# Patient Record
Sex: Male | Born: 1955 | Race: White | Hispanic: No | Marital: Married | Smoking: Current every day smoker
Health system: Southern US, Community
[De-identification: ages and names within clinical notes are randomized; demographics above are authoritative.]

## PROBLEM LIST (undated history)

## (undated) DIAGNOSIS — S3992XA Unspecified injury of lower back, initial encounter: Secondary | ICD-10-CM

## (undated) DIAGNOSIS — N289 Disorder of kidney and ureter, unspecified: Secondary | ICD-10-CM

## (undated) DIAGNOSIS — I10 Essential (primary) hypertension: Secondary | ICD-10-CM

## (undated) DIAGNOSIS — G8929 Other chronic pain: Secondary | ICD-10-CM

## (undated) DIAGNOSIS — E119 Type 2 diabetes mellitus without complications: Secondary | ICD-10-CM

## (undated) HISTORY — PX: ANKLE SURGERY: SHX546

## (undated) HISTORY — PX: BACK SURGERY: SHX140

## (undated) HISTORY — PX: OTHER SURGICAL HISTORY: SHX169

## (undated) HISTORY — PX: SPINAL CORD STIMULATOR IMPLANT: SHX2422

---

## 2005-04-09 ENCOUNTER — Emergency Department: Payer: Self-pay | Admitting: Emergency Medicine

## 2006-05-18 ENCOUNTER — Emergency Department: Payer: Self-pay | Admitting: Internal Medicine

## 2006-08-09 ENCOUNTER — Emergency Department: Payer: Self-pay | Admitting: Emergency Medicine

## 2009-07-12 ENCOUNTER — Ambulatory Visit: Payer: Self-pay | Admitting: Internal Medicine

## 2011-11-29 ENCOUNTER — Inpatient Hospital Stay: Payer: Self-pay | Admitting: Student

## 2011-11-29 LAB — URINALYSIS, COMPLETE
Glucose,UR: NEGATIVE mg/dL (ref 0–75)
Nitrite: NEGATIVE
Ph: 5 (ref 4.5–8.0)
Protein: 100
RBC,UR: 170 /HPF (ref 0–5)
Specific Gravity: 1.027 (ref 1.003–1.030)

## 2011-11-29 LAB — COMPREHENSIVE METABOLIC PANEL
Albumin: 4 g/dL (ref 3.4–5.0)
Alkaline Phosphatase: 78 U/L (ref 50–136)
Anion Gap: 14 (ref 7–16)
BUN: 17 mg/dL (ref 7–18)
Calcium, Total: 8.9 mg/dL (ref 8.5–10.1)
Chloride: 100 mmol/L (ref 98–107)
Creatinine: 1.76 mg/dL — ABNORMAL HIGH (ref 0.60–1.30)
Osmolality: 275 (ref 275–301)
Potassium: 3.4 mmol/L — ABNORMAL LOW (ref 3.5–5.1)
SGOT(AST): 25 U/L (ref 15–37)
Sodium: 134 mmol/L — ABNORMAL LOW (ref 136–145)

## 2011-11-29 LAB — CBC
HCT: 45.3 % (ref 40.0–52.0)
HGB: 15 g/dL (ref 13.0–18.0)
MCH: 29.3 pg (ref 26.0–34.0)
MCHC: 33.2 g/dL (ref 32.0–36.0)
MCV: 88 fL (ref 80–100)
RBC: 5.12 10*6/uL (ref 4.40–5.90)

## 2011-11-29 LAB — TROPONIN I: Troponin-I: <0.02 ng/mL

## 2011-11-29 LAB — PROTIME-INR
INR: 1.1
Prothrombin Time: 14.1 secs (ref 11.5–14.7)

## 2011-11-29 LAB — CK TOTAL AND CKMB (NOT AT ARMC)
CK, Total: 75 U/L (ref 35–232)
CK, Total: 67 U/L (ref 35–232)
CK-MB: <0.5 ng/mL — ABNORMAL LOW (ref 0.5–3.6)

## 2011-11-29 LAB — APTT: Activated PTT: 30 secs (ref 23.6–35.9)

## 2011-11-30 LAB — CBC WITH DIFFERENTIAL/PLATELET
Bands: 1 %
HGB: 13.9 g/dL (ref 13.0–18.0)
Lymphocytes: 13 %
MCH: 30.6 pg (ref 26.0–34.0)
MCHC: 34.3 g/dL (ref 32.0–36.0)
MCV: 89 fL (ref 80–100)
Monocytes: 1 %
RBC: 4.54 10*6/uL (ref 4.40–5.90)
Segmented Neutrophils: 85 %
WBC: 25.8 10*3/uL — ABNORMAL HIGH (ref 3.8–10.6)

## 2011-11-30 LAB — LIPID PANEL
HDL Cholesterol: 23 mg/dL — ABNORMAL LOW (ref 40–60)
Ldl Cholesterol, Calc: 92 mg/dL (ref 0–100)
Triglycerides: 171 mg/dL (ref 0–200)
VLDL Cholesterol, Calc: 34 mg/dL (ref 5–40)

## 2011-11-30 LAB — TROPONIN I: Troponin-I: <0.02 ng/mL

## 2011-11-30 LAB — BASIC METABOLIC PANEL
Anion Gap: 11 (ref 7–16)
Chloride: 106 mmol/L (ref 98–107)
Co2: 23 mmol/L (ref 21–32)
Creatinine: 1.68 mg/dL — ABNORMAL HIGH (ref 0.60–1.30)
EGFR (African American): 53 — ABNORMAL LOW
EGFR (Non-African Amer.): 45 — ABNORMAL LOW
Osmolality: 285 (ref 275–301)
Potassium: 4.3 mmol/L (ref 3.5–5.1)
Sodium: 140 mmol/L (ref 136–145)

## 2011-11-30 LAB — CK TOTAL AND CKMB (NOT AT ARMC): CK-MB: <0.5 ng/mL — ABNORMAL LOW (ref 0.5–3.6)

## 2011-12-01 LAB — BASIC METABOLIC PANEL
Anion Gap: 8 (ref 7–16)
BUN: 21 mg/dL — ABNORMAL HIGH (ref 7–18)
Co2: 25 mmol/L (ref 21–32)
Creatinine: 1.24 mg/dL (ref 0.60–1.30)
EGFR (African American): >60
EGFR (Non-African Amer.): >60
Osmolality: 285 (ref 275–301)
Potassium: 3.8 mmol/L (ref 3.5–5.1)
Sodium: 141 mmol/L (ref 136–145)

## 2011-12-01 LAB — CBC WITH DIFFERENTIAL/PLATELET
Basophil #: 0 10*3/uL (ref 0.0–0.1)
Eosinophil %: 1.4 %
Lymphocyte #: 1.7 10*3/uL (ref 1.0–3.6)
Lymphocyte %: 16 %
MCHC: 33.4 g/dL (ref 32.0–36.0)
Monocyte #: 0.7 x10 3/mm (ref 0.2–1.0)
RBC: 3.66 10*6/uL — ABNORMAL LOW (ref 4.40–5.90)
WBC: 10.5 10*3/uL (ref 3.8–10.6)

## 2011-12-04 LAB — CULTURE, BLOOD (SINGLE)

## 2012-04-25 ENCOUNTER — Ambulatory Visit: Payer: Self-pay

## 2014-02-15 ENCOUNTER — Emergency Department: Payer: Self-pay | Admitting: Emergency Medicine

## 2014-10-14 NOTE — Discharge Summary (Signed)
PATIENT NAME:  Shane Mercado, ANDERSSON MR#:  [id number] DATE OF BIRTH:  10-29-1955  DATE OF ADMISSION:  11/29/2011 DATE OF DISCHARGE:  12/02/2011  PRIMARY CARE PHYSICIAN: Dr. Wilford Grist [facility]  CHIEF COMPLAINT: Fever, dysuria.   DISCHARGE DIAGNOSES:  1. Sepsis in the setting of complicated urinary tract infection and renal failure which is likely acute. 2. Hyponatremia. 3. Hypokalemia. 4. Tachycardia, resolved.  5. Possible obstructive sleep apnea.   SECONDARY DIAGNOSES:  1. Diabetes. 2. Hypertension. 3. Chronic back pain. 4. Spinal stenosis.  5. Osteoporosis.  6. History of epidural lipomatosis at L4-L5. 7. 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   DISCHARGE MEDICATIONS:  1. Cipro 500 mg every 12 hours for seven days. 2. Zolpidem 10 mg once a day at night as needed.  3. AndroGel pack two packs transdermal on one day then on the next day one pack. 4. Baclofen 20 mg 1 to 2 tabs four times daily. 5. Alendronate 70 mg weekly. 6. Novolin N 35 units in the morning and 25 units in the evening. 7. Propranolol 40 mg 1 tab twice daily.  8. OxyContin extended-release 30 mg in the morning, 20 mg at noon, and 20 mg at bedtime. 9. Primidone 50 mg daily. 10. Cymbalta 30 mg twice a day.  11. Simvastatin 20 mg daily.  12. Trazodone 150 mg 2 tabs once a day at bedtime.  13. Promethazine 25 mg every six hours as needed for vomiting.  14. Gabapentin 900 mg three times daily and 600 mg once daily.   DIET: Low sodium, ADA diet.   ACTIVITY: As tolerated.   DISCHARGE FOLLOWUP: Please follow-up with your primary care physician and check a BMP within one week and get a referral for a sleep study.   DISPOSITION: Home.   HISTORY OF PRESENT ILLNESS: For full details of the history and physical, please see the dictation on 11/29/2011 by Dr. Krystal Eaton, but briefly this is a 59 year old obese male with the above medical history who presented with urinary tract  infection and renal failure with complaints of dysuria and fever and was noted to have significant tachycardia in 150s with significant leukocytosis and admitted to the hospitalist service.  SIGNIFICANT LABS AND IMAGING: Initial BUN 17, creatinine 1.76, sodium 134, potassium 3.4.   Urine cultures have grown Escherichia coli, which is pansensitive.  Blood cultures from arrival: No growth to date x2.   D-dimer slightly elevated at 0.66. INR 1.1. Initial WBC 29.7. WBC as of 12/01/2011 was normalized at 10.5 and creatinine was 1.24.   Urinalysis: 1996 WBCs, 3+ leukocyte esterase, no nitrites, 2+ bacteria.   ABG: pH 7.4, pCO2 33, and pO2 71.   Echocardiogram done on 11/30/2011 showed normal ejection fraction. Spectral Doppler flow pattern is normal for age.   Chest x-ray, one view, showed no acute disease of the chest.   Ultrasound of the kidneys bilaterally showed normal renal ultrasound.  Bilateral lower extremity Doppler for deep vein thrombosis did not find evidence for deep vein thrombosis.   HOSPITAL COURSE: The patient was admitted to the hospitalist service and given Levaquin in the ER and started on ceftriaxone. The patient was also started on IV fluids and cultures including blood and urine were obtained. The patient had criteria for sepsis including tachycardia, fever, and significant leukocytosis on arrival. Currently his urine culture is back and is positive for Escherichia coli and he is to continue Cipro for an additional seven days as he has had two  days of ceftriaxone IV. He has no further fever and leukocytosis has significantly improved.   In regards to his renal failure, it was likely secondary to the complicated urinary tract infection and poor p.o. intake prior to arrival, and he was started on aggressive IV fluid resuscitation. An ultrasound of the kidneys was done bilaterally being normal. He had significant improvement with fluid resuscitation and creatinine as of  12/01/2011 is 1.24. He was advised to follow-up with his primary care physician and obtain a repeat BNP for kidney check.   Furthermore, he has mild hyponatremia and hypokalemia which also corrected with IV fluids and potassium replacement. He did have mildly elevated d-dimers and although a PE was considered he did not have any significant shortness of breath nor hypoxia on arrival and therefore ultrasound of the lower extremities was done which was negative for deep vein thrombosis.   He did have sepsis per criteria as well as acute renal failure both of which can potentially cause the elevation of d-dimer. He is not hypoxic at room air nor is he tachycardic and he would be advised to follow up with his primary care physician as an outpatient. He did have loud snoring at night and periods of waking up multiple times at night. There is a concern for possible undiagnosed obstructive sleep apnea, however, he stated that three to four years ago he did have a sleep study and was told it was normal; however, per his wife, his symptoms have progressed he has gained significantly more weight and has worsening symptoms. Therefore, a repeat sleep         study was recommended for him. While hospitalized his pain medications were continued. At this point, he will be discharged with outpatient follow-up as dictated above.   CODE STATUS: FULL CODE.   TOTAL TIME SPENT: 35 minutes.  ____________________________ Krystal EatonShayiq Allex Madia, MD sa:slb D: 12/02/2011 14:08:55 ET T: 12/03/2011 10:24:24 ET JOB#: [id number]  cc: Krystal EatonShayiq Jasyn Mey, MD, <Dictator> Dr. Wilford [facility]istKoontz [facility] Shandie Bertz MD ELECTRONICALLY SIGNED 12/11/2011 [id number]

## 2014-10-14 NOTE — H&P (Signed)
PATIENT NAME:  Shane Mercado, [name] MR#:  [id number] DATE OF BIRTH:  [birth year]  DATE OF ADMISSION:  11/29/2011  REFERRING PHYSICIAN: Joseph ArtAlice Finnell, MD   PRIMARY CARE PHYSICIAN: [facility]   REASON FOR CONSULTATION: Fever, dysuria.   HISTORY OF PRESENT ILLNESS: The patient is a 59 year old morbidly obese Caucasian male with history of diabetes, hypertension, chronic back pain and spinal stenosis status post spinal cord stimulator who presents with the above complaint. The patient stated that his symptoms started three to four days ago with fevers, chills, and dysuria. The patient feels like he is "peeing razor blades". He's had decreased p.o. intake and decreased urination and feels dehydrated. Last night he did have a bout of chest pain which he describes more of palpitations. It went on for several hours. He has no chest pains, dizziness. Pain was not radiating. He had no visual changes. On arrival he was found to have significant tachycardia with rates of 150's and had a positive urinalysis and acute renal failure and the hospitalist service was contacted for further evaluation and management.   PAST MEDICAL HISTORY:  1. Diabetes. 2. Hypertension.  3. Chronic back pain. 4. Spinal stenosis. 5. Osteoporosis.  6. Epidural lipomatosis at L4-L5. 7. 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   FAMILY HISTORY: Dad with Parkinson's, coronary artery disease status post coronary artery bypass graft and cancer. Mom with colon cancer.   SOCIAL HISTORY: Smokes 1 to 2 cigars a month. Denies alcohol or other drug use. He is disabled.    MEDICATIONS: These are not confirmed but, per patient: 1. OxyContin 60 mg in the morning, 40 mg afternoon, and 40 mg at night as needed. 2.  Gabapentin 300 mg 3 times a day.  3. Baclofen 40 mg 4 times a day. 4. Propranolol 20 mg 2 times a day.  5. Insulin NPH 35 units in the morning and 25 units in the afternoon.   6. Trazodone 300 mg at bedtime p.r.n.  7. Phenergan p.r.n.  8. Primidone 5 mg daily.   ALLERGIES: Iodine, morphine, and sulfa drugs.   REVIEW OF SYSTEMS: CONSTITUTIONAL: Positive for fever, fatigue, generalized weakness. No weight changes. EYES: No blurry vision or double vision. ENT: No tinnitus or snoring. Has seasonal allergies. RESPIRATORY: No cough, wheezing, hemoptysis, asthma, or COPD. No painful respirations. CARDIOVASCULAR: Chest pain as above, none now. No orthopnea. No edema. No history of arrhythmia. No dyspnea on exertion. Positive for palpitations and high blood pressure. GI: Chronic nausea. No vomiting. No diarrhea. Some constipation. No abdominal pain. No melena or rectal bleeding. GU: Positive for dysuria, decreased frequency. Denies any recent procedures. ENDOCRINE: No polyuria. No thyroid problems. HEME/LYMPH: Denies any anemia or easy bruising. SKIN: No new rashes. MUSCULOSKELETAL: Has chronic back and joint pains. NEUROLOGIC: Has neuropathy bilateral lower extremities, more so in the left lower extremity. PSYCH: No anxiety or depression. Positive for insomnia.   PHYSICAL EXAMINATION:   VITAL SIGNS: Temperature on arrival 100.6, pulse rate was 153, currently is 132, respiratory rate 20, blood pressure 121/89, oxygen sats 99% on room air.   GENERAL: The patient is a morbidly obese unkempt Caucasian male in no obvious distress talking in full sentences.   HEENT: Normocephalic, atraumatic. Pupils are equal and reactive. Extraocular muscles intact. Poor dentition. Very dry mucous membranes.   NECK: Supple. No thyroid tenderness.   CARDIOVASCULAR: S1, S2, tachycardic. No murmurs, rubs, or gallops.   LUNGS: Clear to auscultation without wheezing.   ABDOMEN:  Soft, nontender, nondistended. No organomegaly noted.   SKIN: Skin is warm. No cyanosis or edema.   LOWER EXTREMITIES: No significant lower extremity edema. There are healed surgical scars in the left lower extremity.    NEUROLOGIC: Cranial nerves II to XII grossly intact. Strength is 5 out of 5 in all extremities. Sensation is intact to light touch. Periodic jerking of extremities and hyperreflexia of lower extremities.   PSYCH: Awake, alert, oriented x3, pleasant, cooperative.   LABORATORY, DIAGNOSTIC, AND RADIOLOGICAL DATA: Glucose 197, BUN 17, creatinine 1.76, sodium 134, potassium 3.4, CO2 20. LFTs total protein 8.8, otherwise within normal limits. Troponin negative x1. CK-MB negative x1. WBC 29.7, hemoglobin 15, hematocrit 45.3, platelets 278. D-dimer is 0.66. INR 1.1. Urinalysis 1+ bilirubin, 1+ ketones, 2+ blood, 3+ leukocyte esterase, 170 RBCs 1996 WBCs, 2+ bacteria. ABG pH 7.4, pCO2 33, pO2 71.   Chest x-ray, portable, one view, showing no acute disease of the chest.   Ultrasound of the renals bilaterally showing normal renal ultrasound.   EKG sinus tachycardia, possible right ventricular conduction delay, nonspecific ST and T wave changes.   ASSESSMENT AND PLAN: We have a 59 year old Caucasian male with diabetes, hypertension, chronic back pain, and early Parkinson's with urinary tract infection, sepsis, acute renal failure, and significant tachycardia.  1. Urinary tract infection/sepsis. The patient is febrile, has leukocytosis and is tachycardic. He has dirty looking urine and symptomatic. He has no pneumonia on x-ray of the chest. At this point blood cultures and urine cultures have been sent. Would start the patient on ceftriaxone and await pan cultures.  2. Tachycardia. Currently the patient is in sinus tachy, rate 132. He has no chest pains or palpitations. This is possibly secondary to dehydration, decreased p.o. intake. As he is asymptomatic currently we would resume his propranolol and start the patient on aggressive IV fluids. We would place the patient on telemetry.  3. Chest pain. This is more likely from palpitations and tachycardia. The rate has come down from 150's to 130's with IV fluid  resuscitation. He is chest pain free. We would cycle the troponins and order an echocardiogram. I would give the patient an aspirin and resume his propranolol. D-dimer is slightly elevated at 0.66, however, this could be from renal failure and/or sepsis. He has no shortness of breath and is sating 99% on room air currently. ABG does show PaO2 of 70. I doubt patient is having PE but we will consider it. I would order lower extremity Doppler's.  4. Acute renal failure. We have an unknown baseline at this point. This could be secondary to dehydration and volume depletion. We would continue IV fluids. The patient has a renal ultrasound which does not show any significant abnormalities.  5. Chronic pain. I would start the patient on Dilaudid until we have confirmed the patient's opioid dosage.  6. Early Parkinson's. I would resume the baclofen.  7. Diabetes. I would continue his outpatient regimen and check a hemoglobin A1c.   CODE STATUS: FULL CODE.   TOTAL TIME SPENT: 65 minutes.   ____________________________ Krystal Eaton, MD sa:drc D: 11/29/2011 17:07:55 ET T: 11/30/2011 07:05:58 ET JOB#: [id number]  cc: Krystal Eaton, MD, <Dictator> Dr. Cristopher Estimable at [facility] MD ELECTRONICALLY SIGNED 12/18/2011 17:09

## 2015-03-31 ENCOUNTER — Emergency Department: Payer: Medicare Other

## 2015-03-31 ENCOUNTER — Encounter: Payer: Self-pay | Admitting: Emergency Medicine

## 2015-03-31 ENCOUNTER — Emergency Department
Admission: EM | Admit: 2015-03-31 | Discharge: 2015-03-31 | Disposition: A | Discharge status: Home / Self Care | Payer: Medicare Other | Attending: Emergency Medicine | Admitting: Emergency Medicine

## 2015-03-31 DIAGNOSIS — S99921A Unspecified injury of right foot, initial encounter: Secondary | ICD-10-CM | POA: Diagnosis present

## 2015-03-31 DIAGNOSIS — Y998 Other external cause status: Secondary | ICD-10-CM | POA: Insufficient documentation

## 2015-03-31 DIAGNOSIS — I10 Essential (primary) hypertension: Secondary | ICD-10-CM | POA: Diagnosis not present

## 2015-03-31 DIAGNOSIS — W132XXA Fall from, out of or through roof, initial encounter: Secondary | ICD-10-CM | POA: Diagnosis not present

## 2015-03-31 DIAGNOSIS — Z720 Tobacco use: Secondary | ICD-10-CM | POA: Diagnosis not present

## 2015-03-31 DIAGNOSIS — S9031XA Contusion of right foot, initial encounter: Secondary | ICD-10-CM

## 2015-03-31 DIAGNOSIS — S90221A Contusion of right lesser toe(s) with damage to nail, initial encounter: Secondary | ICD-10-CM | POA: Insufficient documentation

## 2015-03-31 DIAGNOSIS — Y9389 Activity, other specified: Secondary | ICD-10-CM | POA: Insufficient documentation

## 2015-03-31 DIAGNOSIS — Y9289 Other specified places as the place of occurrence of the external cause: Secondary | ICD-10-CM | POA: Insufficient documentation

## 2015-03-31 DIAGNOSIS — S90121A Contusion of right lesser toe(s) without damage to nail, initial encounter: Secondary | ICD-10-CM

## 2015-03-31 DIAGNOSIS — E119 Type 2 diabetes mellitus without complications: Secondary | ICD-10-CM | POA: Diagnosis not present

## 2015-03-31 HISTORY — DX: Other chronic pain: G89.29

## 2015-03-31 HISTORY — DX: Disorder of kidney and ureter, unspecified: N28.9

## 2015-03-31 HISTORY — DX: Essential (primary) hypertension: I10

## 2015-03-31 HISTORY — DX: Type 2 diabetes mellitus without complications: E11.9

## 2015-03-31 HISTORY — DX: Unspecified injury of lower back, initial encounter: S39.92XA

## 2015-03-31 NOTE — Discharge Instructions (Signed)
Contusion °A contusion is a deep bruise. Contusions happen when an injury causes bleeding under the skin. Symptoms of bruising include pain, swelling, and discolored skin. The skin may turn blue, purple, or yellow. °HOME CARE  °· Rest the injured area. °· If told, put ice on the injured area. °· Put ice in a plastic bag. °· Place a towel between your skin and the bag. °· Leave the ice on for 20 minutes, 2-3 times per day. °· If told, put light pressure (compression) on the injured area using an elastic bandage. Make sure the bandage is not too tight. Remove it and put it back on as told by your doctor. °· If possible, raise (elevate) the injured area above the level of your heart while you are sitting or lying down. °· Take over-the-counter and prescription medicines only as told by your doctor. °GET HELP IF: °· Your symptoms do not get better after several days of treatment. °· Your symptoms get worse. °· You have trouble moving the injured area. °GET HELP RIGHT AWAY IF:  °· You have very bad pain. °· You have a loss of feeling (numbness) in a hand or foot. °· Your hand or foot turns pale or cold. °  °This information is not intended to replace advice given to you by your health care provider. Make sure you discuss any questions you have with your health care provider. °  °Document Released: 11/25/2007 Document Revised: 02/27/2015 Document Reviewed: 10/24/2014 °Elsevier Interactive Patient Education ©2016 [employer] ° °Cryotherapy °Cryotherapy is when you put ice on your injury. Ice helps lessen pain and puffiness (swelling) after an injury. Ice works the best when you start using it in the first 24 to 48 hours after an injury. °HOME CARE °· Put a dry or damp towel between the ice pack and your skin. °· You may press gently on the ice pack. °· Leave the ice on for no more than 10 to 20 minutes at a time. °· Check your skin after 5 minutes to make sure your skin is okay. °· Rest at least 20 minutes between ice  pack uses. °· Stop using ice when your skin loses feeling (numbness). °· Do not use ice on someone who cannot tell you when it hurts. This includes small children and people with memory problems (dementia). °GET HELP RIGHT AWAY IF: °· You have white spots on your skin. °· Your skin turns blue or pale. °· Your skin feels waxy or hard. °· Your puffiness gets worse. °MAKE SURE YOU:  °· Understand these instructions. °· Will watch your condition. °· Will get help right away if you are not doing well or get worse. °  °This information is not intended to replace advice given to you by your health care provider. Make sure you discuss any questions you have with your health care provider. °  °Document Released: 11/25/2007 Document Revised: 08/31/2011 Document Reviewed: 01/29/2011 °Elsevier Interactive Patient Education ©2016 [employer] Inc. ° °

## 2015-03-31 NOTE — ED Notes (Signed)
Pt reports his right foot fell through a weak spot in floor, redness, swelling and bruising noted to right toes. Pt reports pain to toes and top of foot.

## 2015-03-31 NOTE — ED Provider Notes (Signed)
[facility] Emergency Department Provider Note  ____________________________________________  Time seen: Approximately 1:53 PM  I have reviewed the triage vital signs and the nursing notes.   HISTORY  Chief Complaint Foot Injury   HPI Shane Mercado is a 59 y.o. male is here with complaint of right foot pain for 3 days. Patient states that he was walking across the floor when he stepped on "a weak spot". Patient's foot went through the floor and since that time he's had some swelling and bruising especially his second and third toe. He has continued to take his regular oxycodone for his chronic pain without much relief. Pain is increased with walking and slightly decreased with rest and elevation. Currently he states his pain as an 8 out of 10. He denies any head injury or loss of consciousness. He denies any other pain other than his right foot.    Past Medical History  Diagnosis Date  . Chronic pain   . Back injury   . Diabetes mellitus without complication (HCC)   . Hypertension   . Renal disorder     kidney failure    There are no active problems to display for this patient.   Past Surgical History  Procedure Laterality Date  . Left leg surgery    . Spinal cord stimulator implant    . Back surgery    . Ankle surgery      No current outpatient prescriptions on file.  Allergies Iodine; Morphine and related; Nsaids; and Sulfa antibiotics  No family history on file.  Social History Social History  Substance Use Topics  . Smoking status: Current Every Day Smoker    Types: Cigars  . Smokeless tobacco: None  . Alcohol Use: No    Review of Systems Constitutional: No fever/chills Eyes: No visual changes. ENT: No sore throat. Cardiovascular: Denies chest pain. Respiratory: Denies shortness of breath. Gastrointestinal: No abdominal pain.  No nausea, no vomiting.  Genitourinary: Negative for dysuria. Musculoskeletal: Negative for back  pain. Positive right foot pain Skin: Negative for rash. Neurological: Negative for headaches, focal weakness or numbness.  10-point ROS otherwise negative.  ____________________________________________   PHYSICAL EXAM:  VITAL SIGNS: ED Triage Vitals  Enc Vitals Group     BP 03/31/15 1323 122/68 mmHg     Pulse Rate 03/31/15 1323 81     Resp 03/31/15 1323 20     Temp 03/31/15 1323 98.2 F (36.8 C)     Temp Source 03/31/15 1323 Oral     SpO2 03/31/15 1323 95 %     Weight 03/31/15 1323 285 lb (129.275 kg)     Height 03/31/15 1323 6' (1.829 m)     Head Cir --      Peak Flow --      Pain Score 03/31/15 1324 8     Pain Loc --      Pain Edu? --      Excl. in GC? --     Constitutional: Alert and oriented. Well appearing and in no acute distress. Eyes: Conjunctivae are normal. PERRL. EOMI. Head: Atraumatic. Nose: No congestion/rhinnorhea. Neck: No stridor.  No tenderness on palpation cervical spine. Cardiovascular: Normal rate, regular rhythm. Grossly normal heart sounds.  Good peripheral circulation. Respiratory: Normal respiratory effort.  No retractions. Lungs CTAB. Gastrointestinal: Soft and nontender. No distention.  Musculoskeletal: Right foot with mild soft tissue edema. No gross deformity. There is moderate tenderness and ecchymosis of these right second and third toes. Range of motion  is restricted secondary to discomfort. Skin is intact. Femur and tib-fib are negative for tenderness on palpation. Neurologic:  Normal speech and language. No gross focal neurologic deficits are appreciated. No gait instability. Skin:  Skin is warm, dry and intact. No rash noted. Ecchymosis as noted above on foot exam. Psychiatric: Mood and affect are normal. Speech and behavior are normal.  ____________________________________________   LABS (all labs ordered are listed, but only abnormal results are displayed)  Labs Reviewed - No data to display   RADIOLOGY  Right foot no fracture  per radiologist I, Tommi Rumps, personally viewed and evaluated these images (plain radiographs) as part of my medical decision making.  ____________________________________________   PROCEDURES  Procedure(s) performed: None  Critical Care performed: No  ____________________________________________   INITIAL IMPRESSION / ASSESSMENT AND PLAN / ED COURSE  Pertinent labs & imaging results that were available during my care of the patient were reviewed by me and considered in my medical decision making (see chart for details).  Right second and third toe was buddy taped for support. Patient was placed in wooden shoe. Patient states that he is a chronic pain patient and already has oxycodone at home. He also cannot take NSAIDs because of a renal disorder. Patient was encouraged to ice and elevate as needed. He is to follow-up with his PCP if any continued problems. ____________________________________________   FINAL CLINICAL IMPRESSION(S) / ED DIAGNOSES  Final diagnoses:  Contusion of right foot including toes, initial encounter      Tommi Rumps, PA-C 03/31/15 1451  Jene Every, MD 03/31/15 [id number]

## 2015-03-31 NOTE — ED Notes (Signed)
States fell thur a hole   Injury to right foot..having pain to toes and top of foot

## 2015-11-25 ENCOUNTER — Encounter: Payer: Self-pay | Admitting: Emergency Medicine

## 2015-11-25 ENCOUNTER — Emergency Department
Admission: EM | Admit: 2015-11-25 | Discharge: 2015-11-25 | Disposition: A | Discharge status: Home / Self Care | Payer: Medicare Other | Attending: Emergency Medicine | Admitting: Emergency Medicine

## 2015-11-25 DIAGNOSIS — E119 Type 2 diabetes mellitus without complications: Secondary | ICD-10-CM | POA: Insufficient documentation

## 2015-11-25 DIAGNOSIS — R42 Dizziness and giddiness: Secondary | ICD-10-CM

## 2015-11-25 DIAGNOSIS — F17290 Nicotine dependence, other tobacco product, uncomplicated: Secondary | ICD-10-CM | POA: Insufficient documentation

## 2015-11-25 DIAGNOSIS — I10 Essential (primary) hypertension: Secondary | ICD-10-CM | POA: Diagnosis not present

## 2015-11-25 LAB — BASIC METABOLIC PANEL
Anion gap: 10 (ref 5–15)
BUN: 15 mg/dL (ref 6–20)
CHLORIDE: 104 mmol/L (ref 101–111)
CO2: 23 mmol/L (ref 22–32)
CREATININE: 1.91 mg/dL — AB (ref 0.61–1.24)
Calcium: 9.1 mg/dL (ref 8.9–10.3)
GFR calc Af Amer: 43 mL/min — ABNORMAL LOW (ref >60)
GFR calc non Af Amer: 37 mL/min — ABNORMAL LOW (ref >60)
GLUCOSE: 115 mg/dL — AB (ref 65–99)
Potassium: 4.7 mmol/L (ref 3.5–5.1)
SODIUM: 137 mmol/L (ref 135–145)

## 2015-11-25 LAB — CBC
HCT: 43.7 % (ref 40.0–52.0)
Hemoglobin: 14.7 g/dL (ref 13.0–18.0)
MCH: 29.8 pg (ref 26.0–34.0)
MCHC: 33.6 g/dL (ref 32.0–36.0)
MCV: 88.6 fL (ref 80.0–100.0)
PLATELETS: 280 10*3/uL (ref 150–440)
RBC: 4.94 MIL/uL (ref 4.40–5.90)
RDW: 13.6 % (ref 11.5–14.5)
WBC: 11 10*3/uL — AB (ref 3.8–10.6)

## 2015-11-25 LAB — URINALYSIS COMPLETE WITH MICROSCOPIC (ARMC ONLY)
BACTERIA UA: NONE SEEN
GLUCOSE, UA: NEGATIVE mg/dL
Hgb urine dipstick: NEGATIVE
Ketones, ur: NEGATIVE mg/dL
LEUKOCYTES UA: NEGATIVE
Nitrite: NEGATIVE
PROTEIN: 30 mg/dL — AB
RBC / HPF: NONE SEEN RBC/hpf (ref 0–5)
Specific Gravity, Urine: 1.024 (ref 1.005–1.030)
pH: 5 (ref 5.0–8.0)

## 2015-11-25 LAB — TROPONIN I

## 2015-11-25 MED ORDER — SODIUM CHLORIDE 0.9 % IV BOLUS (SEPSIS)
1000.0000 mL | Freq: Once | INTRAVENOUS | Status: AC
  Administered 2015-11-25: 1000 mL via INTRAVENOUS

## 2015-11-25 NOTE — Discharge Instructions (Signed)
Please seek medical attention for any high fevers, chest pain, shortness of breath, change in behavior, persistent vomiting, bloody stool or any other new or concerning symptoms. ° ° °Dizziness °Dizziness is a common problem. It makes you feel unsteady or lightheaded. You may feel like you are about to pass out (faint). Dizziness can lead to injury if you stumble or fall. Anyone can get dizzy, but dizziness is more common in older adults. This condition can be caused by a number of things, including: °· Medicines. °· Dehydration. °· Illness. °HOME CARE °Following these instructions may help with your condition: °Eating and Drinking °· Drink enough fluid to keep your pee (urine) clear or pale yellow. This helps to keep you from getting dehydrated. Try to drink more clear fluids, such as water. °· Do not drink alcohol. °· Limit how much caffeine you drink or eat if told by your doctor. °· Limit how much salt you drink or eat if told by your doctor. °Activity °· Avoid making quick movements. °¨ When you stand up from sitting in a chair, steady yourself until you feel okay. °¨ In the morning, first sit up on the side of the bed. When you feel okay, stand slowly while you hold onto something. Do this until you know that your balance is fine. °· Move your legs often if you need to stand in one place for a long time. Tighten and relax your muscles in your legs while you are standing. °· Do not drive or use heavy machinery if you feel dizzy. °· Avoid bending down if you feel dizzy. Place items in your home so that they are easy for you to reach without leaning over. °Lifestyle °· Do not use any tobacco products, including cigarettes, chewing tobacco, or electronic cigarettes. If you need help quitting, ask your doctor. °· Try to lower your stress level, such as with yoga or meditation. Talk with your doctor if you need help. °General Instructions °· Watch your dizziness for any changes. °· Take medicines only as told by  your doctor. Talk with your doctor if you think that your dizziness is caused by a medicine that you are taking. °· Tell a friend or a family member that you are feeling dizzy. If he or she notices any changes in your behavior, have this person call your doctor. °· Keep all follow-up visits as told by your doctor. This is important. °GET HELP IF: °· Your dizziness does not go away. °· Your dizziness or light-headedness gets worse. °· You feel sick to your stomach (nauseous). °· You have trouble hearing. °· You have new symptoms. °· You are unsteady on your feet or you feel like the room is spinning. °GET HELP RIGHT AWAY IF: °· You throw up (vomit) or have diarrhea and are unable to eat or drink anything. °· You have trouble: °¨ Talking. °¨ Walking. °¨ Swallowing. °¨ Using your arms, hands, or legs. °· You feel generally weak. °· You are not thinking clearly or you have trouble forming sentences. It may take a friend or family member to notice this. °· You have: °¨ Chest pain. °¨ Pain in your belly (abdomen). °¨ Shortness of breath. °¨ Sweating. °· Your vision changes. °· You are bleeding. °· You have a headache. °· You have neck pain or a stiff neck. °· You have a fever. °  °This information is not intended to replace advice given to you by your health care provider. Make sure you discuss any questions you   have with your health care provider. °  °Document Released: 05/28/2011 Document Revised: 10/23/2014 Document Reviewed: 06/04/2014 °Elsevier Interactive Patient Education ©2016 [employer] ° °

## 2015-11-25 NOTE — ED Provider Notes (Signed)
[facility] Emergency Department Provider Note   ____________________________________________  Time seen: ~1755  I have reviewed the triage vital signs and the nursing notes.   HISTORY  Chief Complaint Dizziness   History limited by: Not Limited   HPI Shane Mercado is a 60 y.o. male who presents to the emergency department today because of concerns for dizziness and lightheadedness. This started this morning. It is somewhat intermittent. It is worse when the patient bends over and then stand straight up or stands up. He states that he does occasionally have the sensation of the room spinning. Initially he does have some ringing in his ears. The patient denies any nausea or vomiting. Denies any headache. States he has had similar symptoms in the past.   Past Medical History  Diagnosis Date  . Chronic pain   . Back injury   . Diabetes mellitus without complication (HCC)   . Hypertension   . Renal disorder     kidney failure    There are no active problems to display for this patient.   Past Surgical History  Procedure Laterality Date  . Left leg surgery    . Spinal cord stimulator implant    . Back surgery    . Ankle surgery      No current outpatient prescriptions on file.  Allergies Iodine; Morphine and related; Nsaids; and Sulfa antibiotics  No family history on file.  Social History Social History  Substance Use Topics  . Smoking status: Current Every Day Smoker    Types: Cigars  . Smokeless tobacco: None  . Alcohol Use: No    Review of Systems  Constitutional: Negative for fever. Cardiovascular: Negative for chest pain. Respiratory: Negative for shortness of breath. Gastrointestinal: Negative for abdominal pain, vomiting and diarrhea. Neurological: Negative for headaches, focal weakness or numbness.  10-point ROS otherwise negative.  ____________________________________________   PHYSICAL EXAM:  VITAL SIGNS: ED  Triage Vitals  Enc Vitals Group     BP 11/25/15 1508 111/62 mmHg     Pulse Rate 11/25/15 1508 92     Resp 11/25/15 1508 16     Temp 11/25/15 1508 98.2 F (36.8 C)     Temp Source 11/25/15 1508 Oral     SpO2 11/25/15 1508 96 %     Weight 11/25/15 1508 265 lb (120.203 kg)     Height 11/25/15 1508 6' (1.829 m)     Head Cir --      Peak Flow --      Pain Score 11/25/15 1510 4   Constitutional: Alert and oriented. Well appearing and in no distress. Eyes: Conjunctivae are normal. PERRL. Normal extraocular movements.No nystagmus. ENT   Head: Normocephalic and atraumatic.   Nose: No congestion/rhinnorhea.   Mouth/Throat: Mucous membranes are moist.   Neck: No stridor. Hematological/Lymphatic/Immunilogical: No cervical lymphadenopathy. Cardiovascular: Normal rate, regular rhythm.  No murmurs, rubs, or gallops. Respiratory: Normal respiratory effort without tachypnea nor retractions. Breath sounds are clear and equal bilaterally. No wheezes/rales/rhonchi. Gastrointestinal: Soft and nontender. No distention.  Genitourinary: Deferred Musculoskeletal: Normal range of motion in all extremities. No joint effusions.  No lower extremity tenderness nor edema. Neurologic:  Normal speech and language. No gross focal neurologic deficits are appreciated.  Skin:  Skin is warm, dry and intact. No rash noted. Psychiatric: Mood and affect are normal. Speech and behavior are normal. Patient exhibits appropriate insight and judgment.  ____________________________________________    LABS (pertinent positives/negatives)  Labs Reviewed  BASIC METABOLIC PANEL -  Abnormal; Notable for the following:    Glucose, Bld 115 (*)    Creatinine, Ser 1.91 (*)    GFR calc non Af Amer 37 (*)    GFR calc Af Amer 43 (*)    All other components within normal limits  CBC - Abnormal; Notable for the following:    WBC 11.0 (*)    All other components within normal limits  URINALYSIS COMPLETEWITH MICROSCOPIC  ([facility] ONLY) - Abnormal; Notable for the following:    Color, Urine AMBER (*)    APPearance CLOUDY (*)    Bilirubin Urine 1+ (*)    Protein, ur 30 (*)    Squamous Epithelial / LPF 0-5 (*)    All other components within normal limits  TROPONIN I  CBG MONITORING, ED     ____________________________________________   EKG  I, Phineas SemenGraydon Abryanna Musolino, attending physician, personally viewed and interpreted this EKG  EKG Time: 1520 Rate: 89 Rhythm: normal sinus rhythm Axis: left axis deviation Intervals: qtc 442 QRS: incomplete RBBB ST changes: no st elevation, t wave inversion V1, V2, V3 Impression: abnormal ekg   ____________________________________________    RADIOLOGY  None  ____________________________________________   PROCEDURES  Procedure(s) performed: None  Critical Care performed: No  ____________________________________________   INITIAL IMPRESSION / ASSESSMENT AND PLAN / ED COURSE  Pertinent labs & imaging results that were available during my care of the patient were reviewed by me and considered in my medical decision making (see chart for details).  Patient presented to the emergency department today because of concerns for lightheadedness and dizziness. On exam patient currently without any dizziness. No nystagmus. Will check orthostatics.  Patient received 2 L of fluid. He did feel better after the fluid bolus. Think likely patient symptoms related to orthostatic hypotension. Did discuss good fluid intake and encourage primary care follow-up with the patient. ____________________________________________   FINAL CLINICAL IMPRESSION(S) / ED DIAGNOSES  Final diagnoses:  Dizziness  Orthostatic dizziness     Note: This dictation was prepared with Dragon dictation. Any transcriptional errors that result from this process are unintentional    Phineas SemenGraydon Yousef Huge, MD 11/25/15 [phone]

## 2015-11-25 NOTE — ED Notes (Signed)
States was feeling dizzy and light headed this morning.  Also c/o right shoulder pain. Denies injury.  Onset of symptoms 0930 this morning.

## 2015-11-25 NOTE — ED Notes (Signed)
Pt was ambulated by Sherilyn CooterHenry RN and Pt states that he is still dizy. MD notified.

## 2016-07-22 ENCOUNTER — Emergency Department: Payer: Medicare Other

## 2016-07-22 ENCOUNTER — Encounter: Payer: Self-pay | Admitting: Emergency Medicine

## 2016-07-22 ENCOUNTER — Emergency Department
Admission: EM | Admit: 2016-07-22 | Discharge: 2016-07-22 | Disposition: A | Discharge status: Home / Self Care | Payer: Medicare Other | Attending: Emergency Medicine | Admitting: Emergency Medicine

## 2016-07-22 DIAGNOSIS — I10 Essential (primary) hypertension: Secondary | ICD-10-CM | POA: Insufficient documentation

## 2016-07-22 DIAGNOSIS — E119 Type 2 diabetes mellitus without complications: Secondary | ICD-10-CM | POA: Insufficient documentation

## 2016-07-22 DIAGNOSIS — Z79899 Other long term (current) drug therapy: Secondary | ICD-10-CM | POA: Insufficient documentation

## 2016-07-22 DIAGNOSIS — F17290 Nicotine dependence, other tobacco product, uncomplicated: Secondary | ICD-10-CM | POA: Diagnosis not present

## 2016-07-22 DIAGNOSIS — Z794 Long term (current) use of insulin: Secondary | ICD-10-CM | POA: Insufficient documentation

## 2016-07-22 DIAGNOSIS — J09X2 Influenza due to identified novel influenza A virus with other respiratory manifestations: Secondary | ICD-10-CM | POA: Diagnosis not present

## 2016-07-22 DIAGNOSIS — R509 Fever, unspecified: Secondary | ICD-10-CM | POA: Diagnosis present

## 2016-07-22 DIAGNOSIS — J111 Influenza due to unidentified influenza virus with other respiratory manifestations: Secondary | ICD-10-CM

## 2016-07-22 LAB — BASIC METABOLIC PANEL
ANION GAP: 8 (ref 5–15)
BUN: 11 mg/dL (ref 6–20)
CO2: 25 mmol/L (ref 22–32)
Calcium: 8.5 mg/dL — ABNORMAL LOW (ref 8.9–10.3)
Chloride: 98 mmol/L — ABNORMAL LOW (ref 101–111)
Creatinine, Ser: 1.17 mg/dL (ref 0.61–1.24)
GFR calc Af Amer: >60 mL/min (ref >60)
Glucose, Bld: 156 mg/dL — ABNORMAL HIGH (ref 65–99)
POTASSIUM: 4.1 mmol/L (ref 3.5–5.1)
SODIUM: 131 mmol/L — AB (ref 135–145)

## 2016-07-22 LAB — INFLUENZA PANEL BY PCR (TYPE A & B)
INFLAPCR: POSITIVE — AB
Influenza B By PCR: NEGATIVE

## 2016-07-22 LAB — CBC
HCT: 38.1 % — ABNORMAL LOW (ref 40.0–52.0)
Hemoglobin: 13 g/dL (ref 13.0–18.0)
MCH: 30 pg (ref 26.0–34.0)
MCHC: 34.1 g/dL (ref 32.0–36.0)
MCV: 88 fL (ref 80.0–100.0)
PLATELETS: 236 10*3/uL (ref 150–440)
RBC: 4.33 MIL/uL — ABNORMAL LOW (ref 4.40–5.90)
RDW: 13.1 % (ref 11.5–14.5)
WBC: 11.6 10*3/uL — ABNORMAL HIGH (ref 3.8–10.6)

## 2016-07-22 MED ORDER — IPRATROPIUM-ALBUTEROL 0.5-2.5 (3) MG/3ML IN SOLN
3.0000 mL | Freq: Once | RESPIRATORY_TRACT | Status: AC
  Administered 2016-07-22: 3 mL via RESPIRATORY_TRACT
  Filled 2016-07-22: qty 3

## 2016-07-22 MED ORDER — ALBUTEROL SULFATE HFA 108 (90 BASE) MCG/ACT IN AERS: 2.0000 | INHALATION_SPRAY | Freq: Four times a day (QID) | RESPIRATORY_TRACT | 2 refills | Status: AC | PRN

## 2016-07-22 MED ORDER — SODIUM CHLORIDE 0.9 % IV BOLUS (SEPSIS)
1000.0000 mL | Freq: Once | INTRAVENOUS | Status: AC
  Administered 2016-07-22: 1000 mL via INTRAVENOUS

## 2016-07-22 MED ORDER — ALBUTEROL SULFATE (2.5 MG/3ML) 0.083% IN NEBU
5.0000 mg | INHALATION_SOLUTION | Freq: Once | RESPIRATORY_TRACT | Status: AC
  Administered 2016-07-22: 5 mg via RESPIRATORY_TRACT
  Filled 2016-07-22: qty 6

## 2016-07-22 MED ORDER — BENZONATATE 100 MG PO CAPS: 100.0000 mg | ORAL_CAPSULE | Freq: Four times a day (QID) | ORAL | 0 refills | Status: AC | PRN

## 2016-07-22 MED ORDER — ACETAMINOPHEN 325 MG PO TABS
650.0000 mg | ORAL_TABLET | Freq: Once | ORAL | Status: AC | PRN
  Administered 2016-07-22: 650 mg via ORAL
  Filled 2016-07-22: qty 2

## 2016-07-22 NOTE — Discharge Instructions (Signed)
Please seek medical attention for any high fevers, chest pain, shortness of breath, change in behavior, persistent vomiting, bloody stool or any other new or concerning symptoms.  

## 2016-07-22 NOTE — ED Notes (Signed)
Pt taken off 3L of oxygen to see how his O2 saturation maintains.

## 2016-07-22 NOTE — ED Triage Notes (Signed)
Pt in via POV with complaints of fever, nausea, shortness of breath since this morning.  Pt reports his mother who he stays with and takes care of has recently been diagnosed with the flu.

## 2016-07-22 NOTE — ED Provider Notes (Signed)
[facility] Emergency Department Provider Note   ____________________________________________   I have reviewed the triage vital signs and the nursing notes.   HISTORY  Chief Complaint Influenza   History limited by: Not Limited   HPI Shane Mercado is a 61 y.o. male who presents to the emergency department today because of concerns for flulike illness. Patient states he started feeling bad last night. Today he has had fevers. Had some chills. The patient states he has had a cough and congestion. Patient is chronic pain patient states that the coughing has exacerbated his back pain. In addition the patient states he has a history of tachycardia of unexplained origin. He states that he has been in the hospital before they've tried to bring it down without any success. He has had sick contacts at home.   Past Medical History:  Diagnosis Date  . Back injury   . Chronic pain   . Diabetes mellitus without complication (HCC)   . Hypertension   . Renal disorder    kidney failure    There are no active problems to display for this patient.   Past Surgical History:  Procedure Laterality Date  . ANKLE SURGERY    . BACK SURGERY    . left leg surgery    . SPINAL CORD STIMULATOR IMPLANT      Prior to Admission medications   Not on File    Allergies Iodine; Morphine and related; Nsaids; and Sulfa antibiotics  No family history on file.  Social History Social History  Substance Use Topics  . Smoking status: Current Every Day Smoker    Types: Cigars  . Smokeless tobacco: Never Used  . Alcohol use No    Review of Systems  Constitutional: Negative for fever. Cardiovascular: Negative for chest pain. Respiratory: Negative for shortness of breath. Gastrointestinal: Negative for abdominal pain, vomiting and diarrhea. Neurological: Negative for headaches, focal weakness or numbness.  10-point ROS otherwise  negative.  ____________________________________________   PHYSICAL EXAM:  VITAL SIGNS: ED Triage Vitals  Enc Vitals Group     BP 07/22/16 1746 (!) 155/73     Pulse Rate 07/22/16 1746 (!) 116     Resp 07/22/16 1746 (!) 28     Temp 07/22/16 1746 (!) 102.3 F (39.1 C)     Temp Source 07/22/16 1746 Oral     SpO2 07/22/16 1746 92 %     Weight 07/22/16 1748 265 lb (120.2 kg)     Height 07/22/16 1748 6' (1.829 m)   Constitutional: Alert and oriented. Well appearing and in no distress. Eyes: Conjunctivae are normal. Normal extraocular movements. ENT   Head: Normocephalic and atraumatic.   Nose: No congestion/rhinnorhea.   Mouth/Throat: Mucous membranes are moist.   Neck: No stridor. Hematological/Lymphatic/Immunilogical: No cervical lymphadenopathy. Cardiovascular: Tachycardic. regular rhythm.  No murmurs, rubs, or gallops.  Respiratory: Normal respiratory effort without tachypnea nor retractions. Diffuse expiratory wheezing. Occasional dry cough. Gastrointestinal: Soft and non tender. No rebound. No guarding.  Genitourinary: Deferred Musculoskeletal: Normal range of motion in all extremities. No lower extremity edema. Neurologic:  Normal speech and language. No gross focal neurologic deficits are appreciated.  Skin:  Skin is warm, dry and intact. No rash noted. Psychiatric: Mood and affect are normal. Speech and behavior are normal. Patient exhibits appropriate insight and judgment.  ____________________________________________    LABS (pertinent positives/negatives)  Labs Reviewed  BASIC METABOLIC PANEL - Abnormal; Notable for the following:       Result Value  Sodium 131 (*)    Chloride 98 (*)    Glucose, Bld 156 (*)    Calcium 8.5 (*)    All other components within normal limits  INFLUENZA PANEL BY PCR (TYPE A & B) - Abnormal; Notable for the following:    Influenza A By PCR POSITIVE (*)    All other components within normal limits  CBC - Abnormal;  Notable for the following:    WBC 11.6 (*)    RBC 4.33 (*)    HCT 38.1 (*)    All other components within normal limits     ____________________________________________   EKG  I, Phineas SemenGraydon Canda Podgorski, attending physician, personally viewed and interpreted this EKG  EKG Time: 1804 Rate: 115 Rhythm: sinus tachycardia Axis: normal Intervals: qtc 464 QRS: narrow ST changes: no st elevation Impression: abnormal ekg   ____________________________________________    RADIOLOGY  CXR  IMPRESSION: No active cardiopulmonary disease.   ____________________________________________   PROCEDURES  Procedures  ____________________________________________   INITIAL IMPRESSION / ASSESSMENT AND PLAN / ED COURSE  Pertinent labs & imaging results that were available during my care of the patient were reviewed by me and considered in my medical decision making (see chart for details).  Patient presents with flulike symptoms. Patient's influenza was positive. Chest x-ray without any concerning findings for pneumonia. Patient was given IV fluids. Patient did felt better after. Additionally the patient was given a breathing treatment and felt like this did eat his breathing. Will plan on discharging him with cough medicine and albuterol inhaler.  ____________________________________________   FINAL CLINICAL IMPRESSION(S) / ED DIAGNOSES  Final diagnoses:  Influenza     Note: This dictation was prepared with Dragon dictation. Any transcriptional errors that result from this process are unintentional     Phineas SemenGraydon Maison Agrusa, MD 07/22/16 2220

## 2016-07-22 NOTE — ED Notes (Signed)
Charge RN made aware of pt condition. Pt placed on 2L nasal cannula.  Pt will be placed in subwait once finished with triage.

## 2019-04-08 ENCOUNTER — Encounter: Payer: Self-pay | Admitting: Emergency Medicine

## 2019-04-08 ENCOUNTER — Emergency Department
Admission: EM | Admit: 2019-04-08 | Discharge: 2019-04-08 | Disposition: A | Discharge status: Home / Self Care | Payer: Medicare Other | Attending: Emergency Medicine | Admitting: Emergency Medicine

## 2019-04-08 ENCOUNTER — Other Ambulatory Visit: Payer: Self-pay

## 2019-04-08 ENCOUNTER — Emergency Department: Payer: Medicare Other

## 2019-04-08 DIAGNOSIS — Z794 Long term (current) use of insulin: Secondary | ICD-10-CM | POA: Insufficient documentation

## 2019-04-08 DIAGNOSIS — Y92009 Unspecified place in unspecified non-institutional (private) residence as the place of occurrence of the external cause: Secondary | ICD-10-CM | POA: Diagnosis not present

## 2019-04-08 DIAGNOSIS — Y999 Unspecified external cause status: Secondary | ICD-10-CM | POA: Diagnosis not present

## 2019-04-08 DIAGNOSIS — I10 Essential (primary) hypertension: Secondary | ICD-10-CM | POA: Insufficient documentation

## 2019-04-08 DIAGNOSIS — E119 Type 2 diabetes mellitus without complications: Secondary | ICD-10-CM | POA: Diagnosis not present

## 2019-04-08 DIAGNOSIS — W010XXA Fall on same level from slipping, tripping and stumbling without subsequent striking against object, initial encounter: Secondary | ICD-10-CM | POA: Insufficient documentation

## 2019-04-08 DIAGNOSIS — F17210 Nicotine dependence, cigarettes, uncomplicated: Secondary | ICD-10-CM | POA: Diagnosis not present

## 2019-04-08 DIAGNOSIS — M549 Dorsalgia, unspecified: Secondary | ICD-10-CM

## 2019-04-08 DIAGNOSIS — S39012A Strain of muscle, fascia and tendon of lower back, initial encounter: Secondary | ICD-10-CM

## 2019-04-08 DIAGNOSIS — S3992XA Unspecified injury of lower back, initial encounter: Secondary | ICD-10-CM | POA: Diagnosis present

## 2019-04-08 DIAGNOSIS — Y939 Activity, unspecified: Secondary | ICD-10-CM | POA: Insufficient documentation

## 2019-04-08 DIAGNOSIS — Z79899 Other long term (current) drug therapy: Secondary | ICD-10-CM | POA: Insufficient documentation

## 2019-04-08 MED ORDER — ORPHENADRINE CITRATE ER 100 MG PO TB12: 100.0000 mg | ORAL_TABLET | Freq: Two times a day (BID) | ORAL | 0 refills | Status: AC

## 2019-04-08 MED ORDER — HYDROMORPHONE HCL 1 MG/ML IJ SOLN
1.0000 mg | Freq: Once | INTRAMUSCULAR | Status: AC
  Administered 2019-04-08: 1 mg via INTRAMUSCULAR
  Filled 2019-04-08: qty 1

## 2019-04-08 MED ORDER — ORPHENADRINE CITRATE 30 MG/ML IJ SOLN
60.0000 mg | Freq: Two times a day (BID) | INTRAMUSCULAR | Status: DC
  Administered 2019-04-08: 60 mg via INTRAMUSCULAR
  Filled 2019-04-08: qty 2

## 2019-04-08 NOTE — ED Triage Notes (Signed)
Pt to ED via [facility] from home for back spasms x several years, has been worse the past few days. Pt states that he aggravated it Sunday or Monday Morning, he tripped over his dog and fell, pt states that he heard a pop and has had increased spasms since then.

## 2019-04-08 NOTE — ED Notes (Signed)
Called cab for pt.  Wife at home but has agoraphobia and will not leave house.  Pt paying for cab to get home.

## 2019-04-08 NOTE — ED Provider Notes (Signed)
[facility] Emergency Department Provider Note   ____________________________________________   First MD Initiated Contact with Patient 04/08/19 1332     (approximate)  I have reviewed the triage vital signs and the nursing notes.   HISTORY  Chief Complaint No chief complaint on file.    HPI Shane Mercado is a 63 y.o. male patient complain increasing back spasm status post fall a few days ago.  Patient has a history of chronic back pain and currently takes baclofen and narcotics.  Patient states medicines are not helping the spasms.  Patient denies radicular component to his pain.  Patient denies bladder bowel dysfunction.  Patient rates his discomfort as a 5/10.  No other palliative measures for complaint.  Patient is followed by pain management at [facility].         Past Medical History:  Diagnosis Date  . Back injury   . Chronic pain   . Diabetes mellitus without complication ([place])   . Hypertension   . Renal disorder    kidney failure    There are no active problems to display for this patient.   Past Surgical History:  Procedure Laterality Date  . ANKLE SURGERY    . BACK SURGERY    . left leg surgery    . SPINAL CORD STIMULATOR IMPLANT      Prior to Admission medications   Medication Sig Start Date End Date Taking? Authorizing Provider  albuterol (PROVENTIL HFA;VENTOLIN HFA) 108 (90 Base) MCG/ACT inhaler Inhale 2 puffs into the lungs every 6 (six) hours as needed for wheezing or shortness of breath. 07/22/16   Nance Pear, MD  baclofen (LIORESAL) 10 MG tablet Take 10 mg by mouth 2 (two) times daily as needed. 06/25/16   [provider]  [employer] VITAMIN D3 1000 units capsule Take 1,000 Units by mouth daily. 06/24/16   [provider]  DULoxetine (CYMBALTA) 30 MG capsule Take 30 mg by mouth 2 (two) times daily. 01/20/16   [provider]  gabapentin (NEURONTIN) 300 MG capsule Take 900 mg by mouth 2 (two) times daily.  06/24/16   [provider]  insulin aspart (NOVOLOG) 100 UNIT/ML injection Inject 10-15 Units into the skin 2 (two) times daily as needed for high blood sugar. Take 15 units in the morning and 10 units at night.    [provider]  lisinopril (PRINIVIL,ZESTRIL) 10 MG tablet Take 10 mg by mouth daily. 01/31/16 01/30/17  [provider]  nortriptyline (PAMELOR) 75 MG capsule Take 75 mg by mouth at bedtime. 01/31/16   [provider]  omeprazole (PRILOSEC) 20 MG capsule Take 40 mg by mouth daily. 05/24/16   [provider]  orphenadrine (NORFLEX) 100 MG tablet Take 1 tablet (100 mg total) by mouth 2 (two) times daily. 04/08/19   Sable Feil, PA-C  OXYCONTIN 20 MG 12 hr tablet Take 40 mg by mouth every 12 (twelve) hours. 07/02/16   [provider]  primidone (MYSOLINE) 50 MG tablet Take 50 mg by mouth daily. 11/19/15   [provider]  propranolol (INDERAL) 40 MG tablet Take 40 mg by mouth 2 (two) times daily. 05/24/16   [provider]  simvastatin (ZOCOR) 20 MG tablet Take 20 mg by mouth at bedtime. 06/26/16   [provider]  traZODone (DESYREL) 150 MG tablet Take 300 mg by mouth at bedtime. 05/25/16   [provider]  zolpidem (AMBIEN) 10 MG tablet Take 10 mg by mouth at bedtime as  needed. 06/19/16   [provider]    Allergies Iodine, Morphine and related, Nsaids, and Sulfa antibiotics  No family history on file.  Social History Social History   Tobacco Use  . Smoking status: Current Every Day Smoker    Types: Cigars  . Smokeless tobacco: Never Used  Substance Use Topics  . Alcohol use: No  . Drug use: No    Review of Systems Constitutional: No fever/chills Eyes: No visual changes. ENT: No sore throat. Cardiovascular: Denies chest pain. Respiratory: Denies shortness of breath. Gastrointestinal: No abdominal pain.  No nausea, no vomiting.  No diarrhea.  No constipation. Genitourinary:  Negative for dysuria. Musculoskeletal: Positive for back pain. Skin: Negative for rash. Neurological: Negative for headaches, focal weakness or numbness. Endocrine:  Diabetes and hypertension Allergic/Immunilogical: Lodine, morphine, NSAIDs, and sulfur antibiotics. ____________________________________________   PHYSICAL EXAM:  VITAL SIGNS: ED Triage Vitals  Enc Vitals Group     BP 04/08/19 1301 (!) 143/72     Pulse Rate 04/08/19 1258 83     Resp 04/08/19 1258 16     Temp 04/08/19 1258 99 F (37.2 C)     Temp Source 04/08/19 1258 Oral     SpO2 04/08/19 1258 95 %     Weight 04/08/19 1258 280 lb (127 kg)     Height 04/08/19 1258 6' (1.829 m)     Head Circumference --      Peak Flow --      Pain Score 04/08/19 1258 5     Pain Loc --      Pain Edu? --      Excl. in GC? --     Constitutional: Alert and oriented. Well appearing and in no acute distress.  Obesity Cardiovascular: Normal rate, regular rhythm. Grossly normal heart sounds.  Good peripheral circulation. Respiratory: Normal respiratory effort.  No retractions. Lungs CTAB. Gastrointestinal: Soft and nontender. No distention. No abdominal bruits. No CVA tenderness. Genitourinary: Deferred Musculoskeletal: No obvious spinal deformity.  Patient had palpable right paraspinal muscle spasms.  No lower extremity tenderness nor edema.  No joint effusions. Neurologic:  Normal speech and language. No gross focal neurologic deficits are appreciated. No gait instability. Skin:  Skin is warm, dry and intact. No rash noted. Psychiatric: Mood and affect are normal. Speech and behavior are normal.  ____________________________________________   LABS (all labs ordered are listed, but only abnormal results are displayed)  Labs Reviewed - No data to display ____________________________________________  EKG   ____________________________________________  RADIOLOGY  ED MD interpretation:    Official radiology report(s): Dg  Lumbar Spine 2-3 Views  Result Date: 04/08/2019 CLINICAL DATA:  Fall 5 days ago with increasing back pain. EXAM: LUMBAR SPINE - 2-3 VIEW COMPARISON:  February 15, 2014 FINDINGS: A spinal stimulator device is noted. No acute fractures or malalignment. Mild anterior wedging of L1 is stable. Mild anterior wedging of T12 is stable. Multilevel degenerative disc disease lower lumbar facet degenerative changes. IMPRESSION: Degenerative changes.  No acute fracture. Electronically Signed   By: Gerome Samavid  Williams III M.D   On: 04/08/2019 14:20    ____________________________________________   PROCEDURES  Procedure(s) performed (including Critical Care):  Procedures   ____________________________________________   INITIAL IMPRESSION / ASSESSMENT AND PLAN / ED COURSE  As part of my medical decision making, I reviewed the following data within the electronic MEDICAL RECORD NUMBER         Vinnie LangtonClifford L Gillette was evaluated in Emergency Department on 04/08/2019 for the symptoms described in the  history of present illness. He was evaluated in the context of the global COVID-19 pandemic, which necessitated consideration that the patient might be at risk for infection with the SARS-CoV-2 virus that causes COVID-19. Institutional protocols and algorithms that pertain to the evaluation of patients at risk for COVID-19 are in a state of rapid change based on information released by regulatory bodies including the CDC and federal and state organizations. These policies and algorithms were followed during the patient's care in the ED.  Patient presents with low back pain secondary to a fall yesterday.  Patient has palpable muscle spasm right paraspinal area.  Gust x-ray findings with patient showing increase of degenerative changes in the lumbar spine.  Patient given discharge care instructions and a prescription for Norflex.  Patient advised follow-up pain management  clinic.  ____________________________________________   FINAL CLINICAL IMPRESSION(S) / ED DIAGNOSES  Final diagnoses:  Acute myofascial strain of lumbar region, initial encounter     ED Discharge Orders         Ordered    orphenadrine (NORFLEX) 100 MG tablet  2 times daily     04/08/19 1428           Note:  This document was prepared using Dragon voice recognition software and may include unintentional dictation errors.    Fremon, Zacharia, PA-C 04/08/19 1430    Arnaldo Natal, MD 04/08/19 [phone]

## 2019-04-08 NOTE — Discharge Instructions (Signed)
Advised to discontinue baclofen while taking Norflex.

## 2019-04-08 NOTE — ED Notes (Signed)
Pt reports feels ok to go. Able to get into wheelchair.  Denies other needs.  Mild tachypnea noted. Pt reports because unable to cough (smoker cough) because hurts back too much. VSS. Provider aware.

## 2020-04-16 ENCOUNTER — Observation Stay
Admit: 2020-04-16 | Discharge: 2020-04-16 | Disposition: A | Discharge status: Home / Self Care | Payer: Medicare Other | Attending: Internal Medicine | Admitting: Internal Medicine

## 2020-04-16 ENCOUNTER — Observation Stay: Admit: 2020-04-16 | Payer: Medicare Other

## 2020-04-16 ENCOUNTER — Other Ambulatory Visit: Payer: Self-pay

## 2020-04-16 ENCOUNTER — Encounter: Payer: Self-pay | Admitting: Internal Medicine

## 2020-04-16 ENCOUNTER — Observation Stay
Admission: EM | Admit: 2020-04-16 | Discharge: 2020-04-18 | Disposition: A | Discharge status: Home / Self Care | Payer: Medicare Other | Attending: Internal Medicine | Admitting: Internal Medicine

## 2020-04-16 ENCOUNTER — Emergency Department: Payer: Medicare Other

## 2020-04-16 DIAGNOSIS — E119 Type 2 diabetes mellitus without complications: Secondary | ICD-10-CM | POA: Diagnosis not present

## 2020-04-16 DIAGNOSIS — I248 Other forms of acute ischemic heart disease: Secondary | ICD-10-CM | POA: Insufficient documentation

## 2020-04-16 DIAGNOSIS — E785 Hyperlipidemia, unspecified: Secondary | ICD-10-CM | POA: Diagnosis present

## 2020-04-16 DIAGNOSIS — R778 Other specified abnormalities of plasma proteins: Secondary | ICD-10-CM | POA: Diagnosis not present

## 2020-04-16 DIAGNOSIS — Z20822 Contact with and (suspected) exposure to covid-19: Secondary | ICD-10-CM | POA: Diagnosis not present

## 2020-04-16 DIAGNOSIS — E876 Hypokalemia: Secondary | ICD-10-CM | POA: Diagnosis not present

## 2020-04-16 DIAGNOSIS — Z794 Long term (current) use of insulin: Secondary | ICD-10-CM | POA: Diagnosis not present

## 2020-04-16 DIAGNOSIS — F17210 Nicotine dependence, cigarettes, uncomplicated: Secondary | ICD-10-CM | POA: Insufficient documentation

## 2020-04-16 DIAGNOSIS — R7989 Other specified abnormal findings of blood chemistry: Secondary | ICD-10-CM | POA: Diagnosis not present

## 2020-04-16 DIAGNOSIS — E1165 Type 2 diabetes mellitus with hyperglycemia: Secondary | ICD-10-CM | POA: Diagnosis not present

## 2020-04-16 DIAGNOSIS — F32A Depression, unspecified: Secondary | ICD-10-CM | POA: Diagnosis present

## 2020-04-16 DIAGNOSIS — Z720 Tobacco use: Secondary | ICD-10-CM

## 2020-04-16 DIAGNOSIS — K219 Gastro-esophageal reflux disease without esophagitis: Secondary | ICD-10-CM | POA: Diagnosis present

## 2020-04-16 DIAGNOSIS — Z79899 Other long term (current) drug therapy: Secondary | ICD-10-CM | POA: Insufficient documentation

## 2020-04-16 DIAGNOSIS — I471 Supraventricular tachycardia: Principal | ICD-10-CM | POA: Insufficient documentation

## 2020-04-16 DIAGNOSIS — I10 Essential (primary) hypertension: Secondary | ICD-10-CM | POA: Insufficient documentation

## 2020-04-16 DIAGNOSIS — G8929 Other chronic pain: Secondary | ICD-10-CM | POA: Diagnosis not present

## 2020-04-16 DIAGNOSIS — R000 Tachycardia, unspecified: Secondary | ICD-10-CM | POA: Diagnosis present

## 2020-04-16 LAB — URINE DRUG SCREEN, QUALITATIVE (ARMC ONLY)
Amphetamines, Ur Screen: NOT DETECTED
Barbiturates, Ur Screen: NOT DETECTED
Benzodiazepine, Ur Scrn: NOT DETECTED
Cannabinoid 50 Ng, Ur ~~LOC~~: NOT DETECTED
Cocaine Metabolite,Ur ~~LOC~~: NOT DETECTED
MDMA (Ecstasy)Ur Screen: NOT DETECTED
Methadone Scn, Ur: NOT DETECTED
Opiate, Ur Screen: NOT DETECTED
Phencyclidine (PCP) Ur S: NOT DETECTED
Tricyclic, Ur Screen: NOT DETECTED

## 2020-04-16 LAB — URINALYSIS, COMPLETE (UACMP) WITH MICROSCOPIC
Bacteria, UA: NONE SEEN
Bilirubin Urine: NEGATIVE
Glucose, UA: NEGATIVE mg/dL
Hgb urine dipstick: NEGATIVE
Ketones, ur: 5 mg/dL — AB
Leukocytes,Ua: NEGATIVE
Nitrite: NEGATIVE
Protein, ur: NEGATIVE mg/dL
Specific Gravity, Urine: 1.001 — ABNORMAL LOW (ref 1.005–1.030)
Squamous Epithelial / LPF: NONE SEEN (ref 0–5)
WBC, UA: NONE SEEN WBC/hpf (ref 0–5)
pH: 7 (ref 5.0–8.0)

## 2020-04-16 LAB — CBC WITH DIFFERENTIAL/PLATELET
Abs Immature Granulocytes: 0.07 10*3/uL (ref 0.00–0.07)
Basophils Absolute: 0.1 10*3/uL (ref 0.0–0.1)
Basophils Relative: 1 %
Eosinophils Absolute: 0.3 10*3/uL (ref 0.0–0.5)
Eosinophils Relative: 2 %
HCT: 42.6 % (ref 39.0–52.0)
Hemoglobin: 14.8 g/dL (ref 13.0–17.0)
Immature Granulocytes: 1 %
Lymphocytes Relative: 21 %
Lymphs Abs: 3.1 10*3/uL (ref 0.7–4.0)
MCH: 30.8 pg (ref 26.0–34.0)
MCHC: 34.7 g/dL (ref 30.0–36.0)
MCV: 88.8 fL (ref 80.0–100.0)
Monocytes Absolute: 0.9 10*3/uL (ref 0.1–1.0)
Monocytes Relative: 6 %
Neutro Abs: 10.1 10*3/uL — ABNORMAL HIGH (ref 1.7–7.7)
Neutrophils Relative %: 69 %
Platelets: 324 10*3/uL (ref 150–400)
RBC: 4.8 MIL/uL (ref 4.22–5.81)
RDW: 12.2 % (ref 11.5–15.5)
WBC: 14.5 10*3/uL — ABNORMAL HIGH (ref 4.0–10.5)
nRBC: 0 % (ref 0.0–0.2)

## 2020-04-16 LAB — COMPREHENSIVE METABOLIC PANEL
ALT: 18 U/L (ref 0–44)
AST: 22 U/L (ref 15–41)
Albumin: 4.3 g/dL (ref 3.5–5.0)
Alkaline Phosphatase: 63 U/L (ref 38–126)
Anion gap: 12 (ref 5–15)
BUN: 14 mg/dL (ref 8–23)
CO2: 24 mmol/L (ref 22–32)
Calcium: 8.6 mg/dL — ABNORMAL LOW (ref 8.9–10.3)
Chloride: 104 mmol/L (ref 98–111)
Creatinine, Ser: 0.88 mg/dL (ref 0.61–1.24)
GFR, Estimated: >60 mL/min (ref >60)
Glucose, Bld: 219 mg/dL — ABNORMAL HIGH (ref 70–99)
Potassium: 3.1 mmol/L — ABNORMAL LOW (ref 3.5–5.1)
Sodium: 140 mmol/L (ref 135–145)
Total Bilirubin: 1 mg/dL (ref 0.3–1.2)
Total Protein: 7.5 g/dL (ref 6.5–8.1)

## 2020-04-16 LAB — RESPIRATORY PANEL BY RT PCR (FLU A&B, COVID)
Influenza A by PCR: NEGATIVE
Influenza B by PCR: NEGATIVE
SARS Coronavirus 2 by RT PCR: NEGATIVE

## 2020-04-16 LAB — TSH: TSH: 1.228 u[IU]/mL (ref 0.350–4.500)

## 2020-04-16 LAB — TROPONIN I (HIGH SENSITIVITY)
Troponin I (High Sensitivity): 34 ng/L — ABNORMAL HIGH (ref <18)
Troponin I (High Sensitivity): 89 ng/L — ABNORMAL HIGH (ref <18)
Troponin I (High Sensitivity): 90 ng/L — ABNORMAL HIGH (ref <18)
Troponin I (High Sensitivity): 69 ng/L — ABNORMAL HIGH (ref <18)
Troponin I (High Sensitivity): 58 ng/L — ABNORMAL HIGH (ref <18)

## 2020-04-16 LAB — GLUCOSE, CAPILLARY
Glucose-Capillary: 212 mg/dL — ABNORMAL HIGH (ref 70–99)
Glucose-Capillary: 157 mg/dL — ABNORMAL HIGH (ref 70–99)
Glucose-Capillary: 131 mg/dL — ABNORMAL HIGH (ref 70–99)
Glucose-Capillary: 164 mg/dL — ABNORMAL HIGH (ref 70–99)
Glucose-Capillary: 130 mg/dL — ABNORMAL HIGH (ref 70–99)

## 2020-04-16 LAB — MAGNESIUM: Magnesium: 1.9 mg/dL (ref 1.7–2.4)

## 2020-04-16 LAB — ECHOCARDIOGRAM COMPLETE
AR max vel: 4.02 cm2
AV Area VTI: 4.03 cm2
AV Area mean vel: 3.95 cm2
AV Mean grad: 2 mmHg
AV Peak grad: 3.8 mmHg
Ao pk vel: 0.97 m/s
Area-P 1/2: 3.34 cm2
S' Lateral: 3.19 cm

## 2020-04-16 LAB — HEMOGLOBIN A1C
Hgb A1c MFr Bld: 6.7 % — ABNORMAL HIGH (ref 4.8–5.6)
Mean Plasma Glucose: 146 mg/dL

## 2020-04-16 LAB — HIV ANTIBODY (ROUTINE TESTING W REFLEX): HIV Screen 4th Generation wRfx: NONREACTIVE

## 2020-04-16 MED ORDER — VITAMIN D 25 MCG (1000 UNIT) PO TABS
1000.0000 [IU] | ORAL_TABLET | Freq: Every day | ORAL | Status: DC
  Administered 2020-04-16 – 2020-04-18 (×3): 1000 [IU] via ORAL
  Filled 2020-04-16 (×3): qty 1

## 2020-04-16 MED ORDER — ALBUTEROL SULFATE HFA 108 (90 BASE) MCG/ACT IN AERS
2.0000 | INHALATION_SPRAY | Freq: Four times a day (QID) | RESPIRATORY_TRACT | Status: DC | PRN
  Filled 2020-04-16: qty 6.7

## 2020-04-16 MED ORDER — INSULIN ASPART 100 UNIT/ML ~~LOC~~ SOLN
0.0000 [IU] | Freq: Three times a day (TID) | SUBCUTANEOUS | Status: DC
  Administered 2020-04-16: 1 [IU] via SUBCUTANEOUS
  Administered 2020-04-16 (×2): 2 [IU] via SUBCUTANEOUS
  Administered 2020-04-17: 1 [IU] via SUBCUTANEOUS
  Administered 2020-04-17: 2 [IU] via SUBCUTANEOUS
  Administered 2020-04-18: 1 [IU] via SUBCUTANEOUS
  Administered 2020-04-18: 2 [IU] via SUBCUTANEOUS
  Filled 2020-04-16 (×6): qty 1

## 2020-04-16 MED ORDER — OXYCODONE HCL ER 15 MG PO T12A
40.0000 mg | EXTENDED_RELEASE_TABLET | Freq: Two times a day (BID) | ORAL | Status: DC
  Administered 2020-04-17 – 2020-04-18 (×2): 40 mg via ORAL
  Filled 2020-04-16 (×3): qty 2
  Filled 2020-04-16: qty 4

## 2020-04-16 MED ORDER — ONDANSETRON HCL 4 MG/2ML IJ SOLN: 4.0000 mg | Freq: Three times a day (TID) | INTRAMUSCULAR | Status: DC | PRN

## 2020-04-16 MED ORDER — POTASSIUM CHLORIDE CRYS ER 20 MEQ PO TBCR
40.0000 meq | EXTENDED_RELEASE_TABLET | Freq: Once | ORAL | Status: AC
  Administered 2020-04-16: 40 meq via ORAL
  Filled 2020-04-16: qty 2

## 2020-04-16 MED ORDER — TRAZODONE HCL 100 MG PO TABS
300.0000 mg | ORAL_TABLET | Freq: Every day | ORAL | Status: DC
  Administered 2020-04-16 – 2020-04-17 (×2): 300 mg via ORAL
  Filled 2020-04-16 (×2): qty 3

## 2020-04-16 MED ORDER — ORPHENADRINE CITRATE ER 100 MG PO TB12
100.0000 mg | ORAL_TABLET | Freq: Two times a day (BID) | ORAL | Status: DC
  Administered 2020-04-16: 100 mg via ORAL
  Filled 2020-04-16 (×2): qty 1

## 2020-04-16 MED ORDER — LACTATED RINGERS IV BOLUS
1000.0000 mL | Freq: Once | INTRAVENOUS | Status: AC
  Administered 2020-04-16: 1000 mL via INTRAVENOUS

## 2020-04-16 MED ORDER — INSULIN ASPART 100 UNIT/ML ~~LOC~~ SOLN: 0.0000 [IU] | Freq: Every day | SUBCUTANEOUS | Status: DC

## 2020-04-16 MED ORDER — ASPIRIN EC 81 MG PO TBEC
81.0000 mg | DELAYED_RELEASE_TABLET | Freq: Every day | ORAL | Status: DC
  Administered 2020-04-16 – 2020-04-18 (×3): 81 mg via ORAL
  Filled 2020-04-16 (×4): qty 1

## 2020-04-16 MED ORDER — NORTRIPTYLINE HCL 25 MG PO CAPS: 75.0000 mg | ORAL_CAPSULE | Freq: Every day | ORAL | Status: DC

## 2020-04-16 MED ORDER — ZOLPIDEM TARTRATE 5 MG PO TABS
10.0000 mg | ORAL_TABLET | Freq: Every evening | ORAL | Status: DC | PRN
  Administered 2020-04-17: 10 mg via ORAL
  Filled 2020-04-16: qty 2

## 2020-04-16 MED ORDER — NICOTINE 21 MG/24HR TD PT24
21.0000 mg | MEDICATED_PATCH | Freq: Every day | TRANSDERMAL | Status: DC
  Filled 2020-04-16 (×3): qty 1

## 2020-04-16 MED ORDER — ATORVASTATIN CALCIUM 20 MG PO TABS
40.0000 mg | ORAL_TABLET | Freq: Every day | ORAL | Status: DC
  Administered 2020-04-16 – 2020-04-17 (×2): 40 mg via ORAL
  Filled 2020-04-16 (×2): qty 2

## 2020-04-16 MED ORDER — LISINOPRIL 10 MG PO TABS
10.0000 mg | ORAL_TABLET | Freq: Every day | ORAL | Status: DC
  Administered 2020-04-16 – 2020-04-18 (×3): 10 mg via ORAL
  Filled 2020-04-16 (×3): qty 1

## 2020-04-16 MED ORDER — PROPRANOLOL HCL 20 MG PO TABS
40.0000 mg | ORAL_TABLET | Freq: Two times a day (BID) | ORAL | Status: DC
  Administered 2020-04-16: 40 mg via ORAL
  Filled 2020-04-16: qty 2

## 2020-04-16 MED ORDER — PRIMIDONE 50 MG PO TABS
50.0000 mg | ORAL_TABLET | Freq: Every day | ORAL | Status: DC
  Administered 2020-04-16 – 2020-04-18 (×3): 50 mg via ORAL
  Filled 2020-04-16 (×3): qty 1

## 2020-04-16 MED ORDER — SOTALOL HCL 80 MG PO TABS
80.0000 mg | ORAL_TABLET | Freq: Every day | ORAL | Status: DC
  Administered 2020-04-16 – 2020-04-18 (×3): 80 mg via ORAL
  Filled 2020-04-16 (×4): qty 1

## 2020-04-16 MED ORDER — DULOXETINE HCL 30 MG PO CPEP
30.0000 mg | ORAL_CAPSULE | Freq: Two times a day (BID) | ORAL | Status: DC
  Administered 2020-04-16 – 2020-04-18 (×5): 30 mg via ORAL
  Filled 2020-04-16 (×6): qty 1

## 2020-04-16 MED ORDER — BACLOFEN 10 MG PO TABS
10.0000 mg | ORAL_TABLET | Freq: Two times a day (BID) | ORAL | Status: DC | PRN
  Administered 2020-04-18: 10 mg via ORAL
  Filled 2020-04-16 (×4): qty 1

## 2020-04-16 MED ORDER — HYDRALAZINE HCL 20 MG/ML IJ SOLN
5.0000 mg | INTRAMUSCULAR | Status: DC | PRN
  Administered 2020-04-16: 5 mg via INTRAVENOUS
  Filled 2020-04-16: qty 1

## 2020-04-16 MED ORDER — OXYCODONE HCL ER 10 MG PO T12A
40.0000 mg | EXTENDED_RELEASE_TABLET | Freq: Once | ORAL | Status: AC
  Administered 2020-04-16: 40 mg via ORAL
  Filled 2020-04-16: qty 4

## 2020-04-16 MED ORDER — PANTOPRAZOLE SODIUM 40 MG PO TBEC
40.0000 mg | DELAYED_RELEASE_TABLET | Freq: Every day | ORAL | Status: DC
  Administered 2020-04-16 – 2020-04-18 (×3): 40 mg via ORAL
  Filled 2020-04-16 (×3): qty 1

## 2020-04-16 MED ORDER — GABAPENTIN 300 MG PO CAPS
900.0000 mg | ORAL_CAPSULE | Freq: Two times a day (BID) | ORAL | Status: DC
  Administered 2020-04-16 – 2020-04-18 (×5): 900 mg via ORAL
  Filled 2020-04-16 (×5): qty 3

## 2020-04-16 MED ORDER — MAGNESIUM SULFATE IN D5W 1-5 GM/100ML-% IV SOLN
1.0000 g | Freq: Once | INTRAVENOUS | Status: AC
  Administered 2020-04-16: 1 g via INTRAVENOUS
  Filled 2020-04-16: qty 100

## 2020-04-16 MED ORDER — ENOXAPARIN SODIUM 40 MG/0.4ML ~~LOC~~ SOLN: 40.0000 mg | SUBCUTANEOUS | Status: DC

## 2020-04-16 MED ORDER — ACETAMINOPHEN 325 MG PO TABS: 650.0000 mg | ORAL_TABLET | Freq: Four times a day (QID) | ORAL | Status: DC | PRN

## 2020-04-16 NOTE — ED Triage Notes (Signed)
Pt presents to ER via ems with c/o tachycardia and chest pain.  Per ems, pt was in SVT upon arrival with a rate of 180.  Pt states he has hx of SVT in the past.  Pt was pale and diaphoretic upon ems arrival. EMS gave 6 mg adenosine with no change, then gave 12 mg adenosine with conversion to NSR.

## 2020-04-16 NOTE — H&P (Addendum)
History and Physical    Shane Mercado CHY:[id number] DOB: 1956-06-17 DOA: 04/16/2020  Referring MD/NP/PA:   PCP: Bernerd Limbo, MD   Patient coming from:  The patient is coming from home.  At baseline, pt is independent for most of ADL.        Chief Complaint: palpitation  HPI: Shane Mercado is a 64 y.o. male with medical history significant of hypertension, hyperlipidemia, diabetes mellitus, GERD, depression, chronic back pain, tobacco abuse, who presents with palpitation.  Patient states that he started having feeling of heart pounding last night.  He states he has feeling of indigestion, heavy breathing and clammy. Per EMS, pt was found to have SVT upon arrival with HR up to 180s. Pt was pale and diaphoretic upon ems arrival. EMS gave 6 mg adenosine with no change, then gave another 12 mg adenosine with conversion to NSR. He denies chest pain at this time. He feels like his breathing is back to normal.  Denies nausea, vomiting, diarrhea, abdominal pain, symptoms of UTI or unilateral weakness.  Patient reports increased urinary frequency, but no dysuria or burning with urination.  Patient states that he had similar episode of heart racing before which was less severe.  ED Course: pt was found to have troponin 34, 89, negative UDS, negative urinalysis, negative Covid PCR, potassium 3.1, renal function okay, temperature 99.2, blood pressure 167/100, current heart rate 97, RR 22, oxygen saturation 96% on room air.  Chest x-ray negative.  Patient is placed on progressive benefit observation.  Cardiology, Dr. Welton Flakes is consulted.  Review of Systems:   General: no fevers, chills, no body weight gain,  has fatigue HEENT: no blurry vision, hearing changes or sore throat Respiratory: had dyspnea, no coughing, wheezing CV: no chest pain, has palpitations GI: no nausea, vomiting, abdominal pain, diarrhea, constipation GU: no dysuria, burning on urination, has increased urinary frequency, no  hematuria  Ext: no leg edema Neuro: no unilateral weakness, numbness, or tingling, no vision change or hearing loss Skin: no rash, no skin tear. MSK: No muscle spasm, no deformity, no limitation of range of movement in spin Heme: No easy bruising.  Travel history: No recent long distant travel.  Allergy:  Allergies  Allergen Reactions  . Iodine Anaphylaxis, Hives and Rash  . Morphine And Related Anaphylaxis  . Nsaids Other (See Comments)    Kidney problems  . Sulfa Antibiotics Rash    Past Medical History:  Diagnosis Date  . Back injury   . Chronic pain   . Diabetes mellitus without complication (H[facility])   . Hypertension   . Renal disorder    kidney failure    Past Surgical History:  Procedure Laterality Date  . ANKLE SURGERY    . BACK SURGERY    . left leg surgery    . SPINAL CORD STIMULATOR IMPLANT      Social History:  reports that he has been smoking cigars. He has never used smokeless tobacco. He reports that he does not drink alcohol and does not use drugs.  Family History:  Family History  Problem Relation Age of Onset  . Transient ischemic attack Mother   . Heart disease Father      Prior to Admission medications   Medication Sig Start Date End Date Taking? Authorizing Provider  albuterol (PROVENTIL HFA;VENTOLIN HFA) 108 (90 Base) MCG/ACT inhaler Inhale 2 puffs into the lungs every 6 (six) hours as needed for wheezing or shortness of breath. 07/22/16  Yes Phineas Semen, MD  atorvastatin (LIPITOR) 40 MG tablet Take 40 mg by mouth at bedtime. 03/24/20  Yes [provider]  baclofen (LIORESAL) 10 MG tablet Take 10 mg by mouth 2 (two) times daily as needed. 06/25/16  Yes [provider]  [employer]S VITAMIN D3 1000 units capsule Take 1,000 Units by mouth daily. 06/24/16  Yes [provider]  DULoxetine (CYMBALTA) 30 MG capsule Take 30 mg by mouth 2 (two) times daily. 01/20/16  Yes [provider]  gabapentin (NEURONTIN) 300 MG capsule Take  900 mg by mouth 2 (two) times daily. 06/24/16  Yes [provider]  insulin aspart (NOVOLOG) 100 UNIT/ML injection Inject 10-15 Units into the skin 2 (two) times daily as needed for high blood sugar. Take 15 units in the morning and 10 units at night.   Yes [provider]  metFORMIN (GLUCOPHAGE) 500 MG tablet Take 500 mg by mouth 2 (two) times daily. 03/22/20  Yes [provider]  nortriptyline (PAMELOR) 75 MG capsule Take 75 mg by mouth at bedtime. 01/31/16  Yes [provider]  omeprazole (PRILOSEC) 20 MG capsule Take 40 mg by mouth daily. 05/24/16  Yes [provider]  orphenadrine (NORFLEX) 100 MG tablet Take 1 tablet (100 mg total) by mouth 2 (two) times daily. 04/08/19  Yes Joni Reining, PA-C  OXYCONTIN 20 MG 12 hr tablet Take 40 mg by mouth every 12 (twelve) hours. 07/02/16  Yes [provider]  primidone (MYSOLINE) 50 MG tablet Take 50 mg by mouth daily. 11/19/15  Yes [provider]  propranolol (INDERAL) 40 MG tablet Take 40 mg by mouth 2 (two) times daily. 05/24/16  Yes [provider]  simvastatin (ZOCOR) 20 MG tablet Take 20 mg by mouth at bedtime. 06/26/16  Yes [provider]  traZODone (DESYREL) 150 MG tablet Take 300 mg by mouth at bedtime. 05/25/16  Yes [provider]  zolpidem (AMBIEN) 10 MG tablet Take 10 mg by mouth at bedtime as needed. 06/19/16  Yes [provider]  lisinopril (PRINIVIL,ZESTRIL) 10 MG tablet Take 10 mg by mouth daily. 01/31/16 01/30/17  [provider]    Physical Exam: Vitals:   04/16/20 0630 04/16/20 0700 04/16/20 0800 04/16/20 0830  BP: (!) 158/101 (!) 148/86 (!) 157/87 (!) 144/82  Pulse: 98 93 88 88  Resp: 20 (!) 26 19 19   Temp:      TempSrc:      SpO2: 95% 94% 94% 94%   General: Not in acute distress HEENT:       Eyes: PERRL, EOMI, no scleral icterus.       ENT: No discharge from the ears and nose, no pharynx injection, no tonsillar enlargement.         Neck: No JVD, no bruit, no mass felt. Heme: No neck lymph node enlargement. Cardiac: S1/S2, RRR, No murmurs, No gallops or rubs. Respiratory:  No rales, wheezing, rhonchi or rubs. GI: Soft, nondistended, nontender, no rebound pain, no organomegaly, BS present. GU: No hematuria Ext: No pitting leg edema bilaterally. 2+DP/PT pulse bilaterally. Musculoskeletal: No joint deformities, No joint redness or warmth, no limitation of ROM in spin. Skin: No rashes.  Neuro: Alert, oriented X3, cranial nerves II-XII grossly intact, moves all extremities normally. Psych: Patient is not psychotic, no suicidal or hemocidal ideation.  Labs on Admission: I have personally reviewed following labs and imaging studies  CBC: Recent Labs  Lab 04/16/20 0212  WBC 14.5*  NEUTROABS 10.1*  HGB 14.8  HCT 42.6  MCV  88.8  PLT 324   Basic Metabolic Panel: Recent Labs  Lab 04/16/20 0212  NA 140  K 3.1*  CL 104  CO2 24  GLUCOSE 219*  BUN 14  CREATININE 0.88  CALCIUM 8.6*  MG 1.9   GFR: CrCl cannot be calculated (Unknown ideal weight.). Liver Function Tests: Recent Labs  Lab 04/16/20 0212  AST 22  ALT 18  ALKPHOS 63  BILITOT 1.0  PROT 7.5  ALBUMIN 4.3   No results for input(s): LIPASE, AMYLASE in the last 168 hours. No results for input(s): AMMONIA in the last 168 hours. Coagulation Profile: No results for input(s): INR, PROTIME in the last 168 hours. Cardiac Enzymes: No results for input(s): CKTOTAL, CKMB, CKMBINDEX, TROPONINI in the last 168 hours. BNP (last 3 results) No results for input(s): PROBNP in the last 8760 hours. HbA1C: No results for input(s): HGBA1C in the last 72 hours. CBG: Recent Labs  Lab 04/16/20 0211 04/16/20 0736  GLUCAP 212* 157*   Lipid Profile: No results for input(s): CHOL, HDL, LDLCALC, TRIG, CHOLHDL, LDLDIRECT in the last 72 hours. Thyroid Function Tests: Recent Labs    04/16/20 0737  TSH 1.228   Anemia Panel: No results for input(s):  VITAMINB12, FOLATE, FERRITIN, TIBC, IRON, RETICCTPCT in the last 72 hours. Urine analysis:    Component Value Date/Time   COLORURINE COLORLESS (A) 04/16/2020 0248   APPEARANCEUR CLEAR (A) 04/16/2020 0248   APPEARANCEUR Turbid 11/29/2011 1225   LABSPEC 1.001 (L) 04/16/2020 0248   LABSPEC 1.027 11/29/2011 1225   PHURINE 7.0 04/16/2020 0248   GLUCOSEU NEGATIVE 04/16/2020 0248   GLUCOSEU Negative 11/29/2011 1225   HGBUR NEGATIVE 04/16/2020 0248   BILIRUBINUR NEGATIVE 04/16/2020 0248   BILIRUBINUR 1+ 11/29/2011 1225   KETONESUR 5 (A) 04/16/2020 0248   PROTEINUR NEGATIVE 04/16/2020 0248   NITRITE NEGATIVE 04/16/2020 0248   LEUKOCYTESUR NEGATIVE 04/16/2020 0248   LEUKOCYTESUR 3+ 11/29/2011 1225   Sepsis Labs: @LABRCNTIP (procalcitonin:4,lacticidven:4) ) Recent Results (from the past 240 hour(s))  Respiratory Panel by RT PCR (Flu A&B, Covid) - Nasopharyngeal Swab     Status: None   Collection Time: 04/16/20  2:49 AM   Specimen: Nasopharyngeal Swab  Result Value Ref Range Status   SARS Coronavirus 2 by RT PCR NEGATIVE NEGATIVE Final    Comment: (NOTE) SARS-CoV-2 target nucleic acids are NOT DETECTED.  The SARS-CoV-2 RNA is generally detectable in upper respiratoy specimens during the acute phase of infection. The lowest concentration of SARS-CoV-2 viral copies this assay can detect is 131 copies/mL. A negative result does not preclude SARS-Cov-2 infection and should not be used as the sole basis for treatment or other patient management decisions. A negative result may occur with  improper specimen collection/handling, submission of specimen other than nasopharyngeal swab, presence of viral mutation(s) within the areas targeted by this assay, and inadequate number of viral copies (<131 copies/mL). A negative result must be combined with clinical observations, patient history, and epidemiological information. The expected result is Negative.  Fact Sheet for Patients:   04/18/20  Fact Sheet for Healthcare Providers:  https://www.moore.com/  This test is no t yet approved or cleared by the https://www.young.biz/ FDA and  has been authorized for detection and/or diagnosis of SARS-CoV-2 by FDA under an Emergency Use Authorization (EUA). This EUA will remain  in effect (meaning this test can be used) for the duration of the COVID-19 declaration under Section 564(b)(1) of the Act, 21 U.S.C. section 360bbb-3(b)(1), unless the authorization is terminated or revoked sooner.  Influenza A by PCR NEGATIVE NEGATIVE Final   Influenza B by PCR NEGATIVE NEGATIVE Final    Comment: (NOTE) The Xpert Xpress SARS-CoV-2/FLU/RSV assay is intended as an aid in  the diagnosis of influenza from Nasopharyngeal swab specimens and  should not be used as a sole basis for treatment. Nasal washings and  aspirates are unacceptable for Xpert Xpress SARS-CoV-2/FLU/RSV  testing.  Fact Sheet for Patients: https://www.moore.com/https://www.fda.gov/media/142436/download  Fact Sheet for Healthcare Providers: https://www.young.biz/https://www.fda.gov/media/142435/download  This test is not yet approved or cleared by the [place] FDA and  has been authorized for detection and/or diagnosis of SARS-CoV-2 by  FDA under an Emergency Use Authorization (EUA). This EUA will remain  in effect (meaning this test can be used) for the duration of the  Covid-19 declaration under Section 564(b)(1) of the Act, 21  U.S.C. section 360bbb-3(b)(1), unless the authorization is  terminated or revoked. Performed at [facility], [address] [place], [place] [address]      Radiological Exams on Admission: DG Chest Portable 1 View  Result Date: 04/16/2020 CLINICAL DATA:  Chest pain EXAM: PORTABLE CHEST 1 VIEW COMPARISON:  07/22/2016 FINDINGS: The heart size and mediastinal contours are within normal limits. Both lungs are clear. The visualized skeletal structures are  unremarkable. IMPRESSION: No active disease. Electronically Signed   By: Deatra RobinsonKevin  Herman M.D.   On: 04/16/2020 02:28     EKG done in ED (after converted to NSR): I have personally reviewed.  Sinus rhythm, QTC 470, low voltage, LAD, early R wave progression, mild T wave inversion in V2-V4 and lead I/aVL.  Assessment/Plan Principal Problem:   SVT (supraventricular tachycardia) (HCC) Active Problems:   Elevated troponin   Diabetes mellitus without complication (HCC)   Hypertension   HLD (hyperlipidemia)   GERD (gastroesophageal reflux disease)   Depression   Chronic pain   Tobacco abuse   Hypokalemia   SVT (supraventricular tachycardia) (HCC): Etiology is not clear.  UDS negative.  The patient states that he had a similar episode which was less severe than before.  Patient is converted to normal sinus rhythm with adenosine,  currently heart rate 97.  Cardiology, Dr. Welton FlakesKhan is consulted.  -place in progressive bed as inpatient -continue home propranolol -check TSH -follow-up Dr. Milta DeitersKhan's recommendation  Elevated troponin: Troponin level 34, 89, no chest pain currently.  Likely due to demand ischemia secondary to SVT. -Trend troponin - ASA 81 mg daily - lipitor -check A1c, FLP -repeat EKG in morning -2d echo.  Diabetes mellitus without complication [facility](HCC): Recent A1c 7.0, fairly controlled.  Patient is taking NovoLog and Metformin at home -sliding scale insulin  Hypertension -IV hydralazine as needed -continue propranolol, lisinopril  HLD (hyperlipidemia): Patient has Zocor and Lipitor on his list -continue Lipitor only  GERD (gastroesophageal reflux disease) -Protonix  Depression -continue home medication  Chronic pain: Mainly back pain -patient is on OxyContin and muscle relaxer  Tobacco abuse -nicotine patch  Hypokalemia: Potassium 3.1.  Magnesium 1.9 --repleted potassium: 40 mEq of potassium chloride x2 -give 1 g of magnesium sulfate         DVT ppx: SQ  Lovenox Code Status: Full code Family Communication: not done, no family member is at bed side.     Disposition Plan:  Anticipate discharge back to previous environment Consults called:  Dr. Welton FlakesKhan of card Admission status:  progressive unit for obs     Status is: Observation  The patient remains OBS appropriate and will d/c before 2 midnights.  Dispo: The  patient is from: Home              Anticipated d/c is to: Home              Anticipated d/c date is: 2 days              Patient currently is not medically stable to d/c.          Date of Service 04/16/2020    Lorretta Harp [facility]   If 7PM-7AM, please contact night-coverage [email or web address] 04/16/2020, 8:55 AM

## 2020-04-16 NOTE — Consult Note (Signed)
Shane Mercado is a 64 y.o. male  [id number]  Primary Cardiologist: Adrian Blackwater Reason for Consultation: SVT with palpitation  HPI: Is a 64 year old white male with history of smoking cigar and hypertension presented to the hospital with palpitation and dizziness and was found to be in SVT by EMS and was given adenosine IV injection twice and converted to sinus rhythm.  Patient still feels a little short of breath and some tightness in the chest associated with mildly elevated troponin.   Review of Systems: No syncope   Past Medical History:  Diagnosis Date  . Back injury   . Chronic pain   . Diabetes mellitus without complication (HCC)   . Hypertension   . Renal disorder    kidney failure    (Not in a hospital admission)    . insulin aspart  0-5 Units Subcutaneous QHS  . insulin aspart  0-9 Units Subcutaneous TID WC  . nicotine  21 mg Transdermal Daily  . potassium chloride  40 mEq Oral Once    Infusions: . magnesium sulfate bolus IVPB      Allergies  Allergen Reactions  . Iodine Anaphylaxis, Hives and Rash  . Morphine And Related Anaphylaxis  . Nsaids Other (See Comments)    Kidney problems  . Sulfa Antibiotics Rash    Social History   Socioeconomic History  . Marital status: Married    Spouse name: Not on file  . Number of children: Not on file  . Years of education: Not on file  . Highest education level: Not on file  Occupational History  . Not on file  Tobacco Use  . Smoking status: Current Every Day Smoker    Types: Cigars  . Smokeless tobacco: Never Used  Substance and Sexual Activity  . Alcohol use: No  . Drug use: No  . Sexual activity: Not on file  Other Topics Concern  . Not on file  Social History Narrative  . Not on file   Social Determinants of Health   Financial Resource Strain:   . Difficulty of Paying Living Expenses: Not on file  Food Insecurity:   . Worried About Programme researcher, broadcasting/film/video in the Last Year: Not on file  .  Ran Out of Food in the Last Year: Not on file  Transportation Needs:   . Lack of Transportation (Medical): Not on file  . Lack of Transportation (Non-Medical): Not on file  Physical Activity:   . Days of Exercise per Week: Not on file  . Minutes of Exercise per Session: Not on file  Stress:   . Feeling of Stress : Not on file  Social Connections:   . Frequency of Communication with Friends and Family: Not on file  . Frequency of Social Gatherings with Friends and Family: Not on file  . Attends Religious Services: Not on file  . Active Member of Clubs or Organizations: Not on file  . Attends Banker Meetings: Not on file  . Marital Status: Not on file  Intimate Partner Violence:   . Fear of Current or Ex-Partner: Not on file  . Emotionally Abused: Not on file  . Physically Abused: Not on file  . Sexually Abused: Not on file    No family history on file.  PHYSICAL EXAM: Vitals:   04/16/20 0800 04/16/20 0830  BP: (!) 157/87 (!) 144/82  Pulse: 88 88  Resp: 19 19  Temp:    SpO2: 94% 94%     Intake/Output  Summary (Last 24 hours) at 04/16/2020 0840 Last data filed at 04/16/2020 0445 Gross per 24 hour  Intake 1000 ml  Output --  Net 1000 ml    General:  Well appearing. No respiratory difficulty HEENT: normal Neck: supple. no JVD. Carotids 2+ bilat; no bruits. No lymphadenopathy or thryomegaly appreciated. Cor: PMI nondisplaced. Regular rate & rhythm. No rubs, gallops or murmurs. Lungs: clear Abdomen: soft, nontender, nondistended. No hepatosplenomegaly. No bruits or masses. Good bowel sounds. Extremities: no cyanosis, clubbing, rash, edema Neuro: alert & oriented x 3, cranial nerves grossly intact. moves all 4 extremities w/o difficulty. Affect pleasant.  ECG: Sinus rhythm with poor R wave progression nonspecific ST-T changes  Results for orders placed or performed during the hospital encounter of 04/16/20 (from the past 24 hour(s))  Glucose, capillary      Status: Abnormal   Collection Time: 04/16/20  2:11 AM  Result Value Ref Range   Glucose-Capillary 212 (H) 70 - 99 mg/dL  Troponin I (High Sensitivity)     Status: Abnormal   Collection Time: 04/16/20  2:12 AM  Result Value Ref Range   Troponin I (High Sensitivity) 34 (H) <18 ng/L  CBC with Differential     Status: Abnormal   Collection Time: 04/16/20  2:12 AM  Result Value Ref Range   WBC 14.5 (H) 4.0 - 10.5 K/uL   RBC 4.80 4.22 - 5.81 MIL/uL   Hemoglobin 14.8 13.0 - 17.0 g/dL   HCT 56.3 39 - 52 %   MCV 88.8 80.0 - 100.0 fL   MCH 30.8 26.0 - 34.0 pg   MCHC 34.7 30.0 - 36.0 g/dL   RDW 14.9 70.2 - 63.7 %   Platelets 324 150 - 400 K/uL   nRBC 0.0 0.0 - 0.2 %   Neutrophils Relative % 69 %   Neutro Abs 10.1 (H) 1.7 - 7.7 K/uL   Lymphocytes Relative 21 %   Lymphs Abs 3.1 0.7 - 4.0 K/uL   Monocytes Relative 6 %   Monocytes Absolute 0.9 0.1 - 1.0 K/uL   Eosinophils Relative 2 %   Eosinophils Absolute 0.3 0.0 - 0.5 K/uL   Basophils Relative 1 %   Basophils Absolute 0.1 0.0 - 0.1 K/uL   Immature Granulocytes 1 %   Abs Immature Granulocytes 0.07 0.00 - 0.07 K/uL  Comprehensive metabolic panel     Status: Abnormal   Collection Time: 04/16/20  2:12 AM  Result Value Ref Range   Sodium 140 135 - 145 mmol/L   Potassium 3.1 (L) 3.5 - 5.1 mmol/L   Chloride 104 98 - 111 mmol/L   CO2 24 22 - 32 mmol/L   Glucose, Bld 219 (H) 70 - 99 mg/dL   BUN 14 8 - 23 mg/dL   Creatinine, Ser 8.58 0.61 - 1.24 mg/dL   Calcium 8.6 (L) 8.9 - 10.3 mg/dL   Total Protein 7.5 6.5 - 8.1 g/dL   Albumin 4.3 3.5 - 5.0 g/dL   AST 22 15 - 41 U/L   ALT 18 0 - 44 U/L   Alkaline Phosphatase 63 38 - 126 U/L   Total Bilirubin 1.0 0.3 - 1.2 mg/dL   GFR, Estimated >85 >02 mL/min   Anion gap 12 5 - 15  Magnesium     Status: None   Collection Time: 04/16/20  2:12 AM  Result Value Ref Range   Magnesium 1.9 1.7 - 2.4 mg/dL  Urinalysis, Complete w Microscopic     Status: Abnormal   Collection Time:  04/16/20  2:48 AM   Result Value Ref Range   Color, Urine COLORLESS (A) YELLOW   APPearance CLEAR (A) CLEAR   Specific Gravity, Urine 1.001 (L) 1.005 - 1.030   pH 7.0 5.0 - 8.0   Glucose, UA NEGATIVE NEGATIVE mg/dL   Hgb urine dipstick NEGATIVE NEGATIVE   Bilirubin Urine NEGATIVE NEGATIVE   Ketones, ur 5 (A) NEGATIVE mg/dL   Protein, ur NEGATIVE NEGATIVE mg/dL   Nitrite NEGATIVE NEGATIVE   Leukocytes,Ua NEGATIVE NEGATIVE   RBC / HPF 0-5 0 - 5 RBC/hpf   WBC, UA NONE SEEN 0 - 5 WBC/hpf   Bacteria, UA NONE SEEN NONE SEEN   Squamous Epithelial / LPF NONE SEEN 0 - 5  Urine Drug Screen, Qualitative ([facility] only)     Status: None   Collection Time: 04/16/20  2:48 AM  Result Value Ref Range   Tricyclic, Ur Screen NONE DETECTED NONE DETECTED   Amphetamines, Ur Screen NONE DETECTED NONE DETECTED   MDMA (Ecstasy)Ur Screen NONE DETECTED NONE DETECTED   Cocaine Metabolite,Ur [place] NONE DETECTED NONE DETECTED   Opiate, Ur Screen NONE DETECTED NONE DETECTED   Phencyclidine (PCP) Ur S NONE DETECTED NONE DETECTED   Cannabinoid 50 Ng, Ur [place] NONE DETECTED NONE DETECTED   Barbiturates, Ur Screen NONE DETECTED NONE DETECTED   Benzodiazepine, Ur Scrn NONE DETECTED NONE DETECTED   Methadone Scn, Ur NONE DETECTED NONE DETECTED  Respiratory Panel by RT PCR (Flu A&B, Covid) - Nasopharyngeal Swab     Status: None   Collection Time: 04/16/20  2:49 AM   Specimen: Nasopharyngeal Swab  Result Value Ref Range   SARS Coronavirus 2 by RT PCR NEGATIVE NEGATIVE   Influenza A by PCR NEGATIVE NEGATIVE   Influenza B by PCR NEGATIVE NEGATIVE  Troponin I (High Sensitivity)     Status: Abnormal   Collection Time: 04/16/20  4:42 AM  Result Value Ref Range   Troponin I (High Sensitivity) 89 (H) <18 ng/L  Glucose, capillary     Status: Abnormal   Collection Time: 04/16/20  7:36 AM  Result Value Ref Range   Glucose-Capillary 157 (H) 70 - 99 mg/dL  Troponin I (High Sensitivity)     Status: Abnormal   Collection Time: 04/16/20  7:37 AM   Result Value Ref Range   Troponin I (High Sensitivity) 90 (H) <18 ng/L  TSH     Status: None   Collection Time: 04/16/20  7:37 AM  Result Value Ref Range   TSH 1.228 0.350 - 4.500 uIU/mL   DG Chest Portable 1 View  Result Date: 04/16/2020 CLINICAL DATA:  Chest pain EXAM: PORTABLE CHEST 1 VIEW COMPARISON:  07/22/2016 FINDINGS: The heart size and mediastinal contours are within normal limits. Both lungs are clear. The visualized skeletal structures are unremarkable. IMPRESSION: No active disease. Electronically Signed   By: Deatra Robinson M.D.   On: 04/16/2020 02:28     ASSESSMENT AND PLAN: Supraventricular tachycardia responding to adenosine now in normal sinus rhythm.  Advise getting echocardiogram and will consider either sotalol or amiodarone as initial therapy for supraventricular tachycardia.  This will depend on ejection fraction.  Gagan Dillion A

## 2020-04-16 NOTE — ED Notes (Signed)
Pt sleeping upon RN entering the room but was easy to wake. No questions at this time and NAD noted.

## 2020-04-16 NOTE — Progress Notes (Signed)
*  PRELIMINARY RESULTS* Echocardiogram 2D Echocardiogram has been performed.  Shane Mercado 04/16/2020, 11:39 AM

## 2020-04-16 NOTE — ED Notes (Signed)
Date and time results received: 04/16/20 0530  Test: Troponin Critical Value: 54  Name of Provider Notified: Dr. Don Perking   Orders Received? Or Actions Taken?: Orders Received - See Orders for details

## 2020-04-16 NOTE — ED Provider Notes (Signed)
[facility] Emergency Department Provider Note  ____________________________________________  Time seen: Approximately 3:14 AM  I have reviewed the triage vital signs and the nursing notes.   HISTORY  Chief Complaint Tachycardia   HPI Shane Mercado is a 64 y.o. male history of chronic pain syndrome, diabetes, hypertension who presents for evaluation of tachycardia.  Patient reports being in his usual state of health when he went to sleep this evening.  He reports that he woke up several times throughout the night having to urinate.  Every time when he laid back down in bed he felt his heart pounding in his chest.  Eventually he started feeling like his breathing was getting heavy, he started to have some indigestion, and he felt clammy which prompted him to call 911.  On EMS arrived patient was found to be in SVT.  He was given 6 mg of adenosine followed by 12 mg of adenosine with conversion.  Patient denies any prior history of SVT.  Denies any fever or chills.  He denies chest pain at this time, he feels like his breathing is back to normal.  No cough or fever.  He is vaccinated against Covid and denies any known exposures.  No abdominal pain, no dysuria or hematuria but has had urinary frequency.  He does not take any diuretic.  Past Medical History:  Diagnosis Date  . Back injury   . Chronic pain   . Diabetes mellitus without complication (HCC)   . Hypertension   . Renal disorder    kidney failure    Past Surgical History:  Procedure Laterality Date  . ANKLE SURGERY    . BACK SURGERY    . left leg surgery    . SPINAL CORD STIMULATOR IMPLANT      Prior to Admission medications   Medication Sig Start Date End Date Taking? Authorizing Provider  albuterol (PROVENTIL HFA;VENTOLIN HFA) 108 (90 Base) MCG/ACT inhaler Inhale 2 puffs into the lungs every 6 (six) hours as needed for wheezing or shortness of breath. 07/22/16  Yes Phineas Semen, MD   atorvastatin (LIPITOR) 40 MG tablet Take 40 mg by mouth at bedtime. 03/24/20  Yes [provider]  baclofen (LIORESAL) 10 MG tablet Take 10 mg by mouth 2 (two) times daily as needed. 06/25/16  Yes [provider]  [employer] VITAMIN D3 1000 units capsule Take 1,000 Units by mouth daily. 06/24/16  Yes [provider]  DULoxetine (CYMBALTA) 30 MG capsule Take 30 mg by mouth 2 (two) times daily. 01/20/16  Yes [provider]  gabapentin (NEURONTIN) 300 MG capsule Take 900 mg by mouth 2 (two) times daily. 06/24/16  Yes [provider]  insulin aspart (NOVOLOG) 100 UNIT/ML injection Inject 10-15 Units into the skin 2 (two) times daily as needed for high blood sugar. Take 15 units in the morning and 10 units at night.   Yes [provider]  metFORMIN (GLUCOPHAGE) 500 MG tablet Take 500 mg by mouth 2 (two) times daily. 03/22/20  Yes [provider]  nortriptyline (PAMELOR) 75 MG capsule Take 75 mg by mouth at bedtime. 01/31/16  Yes [provider]  omeprazole (PRILOSEC) 20 MG capsule Take 40 mg by mouth daily. 05/24/16  Yes [provider]  orphenadrine (NORFLEX) 100 MG tablet Take 1 tablet (100 mg total) by mouth 2 (two) times daily. 04/08/19  Yes Joni Reining, PA-C  OXYCONTIN 20 MG 12 hr tablet Take 40 mg by mouth every 12 (twelve)  hours. 07/02/16  Yes [provider]  primidone (MYSOLINE) 50 MG tablet Take 50 mg by mouth daily. 11/19/15  Yes [provider]  propranolol (INDERAL) 40 MG tablet Take 40 mg by mouth 2 (two) times daily. 05/24/16  Yes [provider]  simvastatin (ZOCOR) 20 MG tablet Take 20 mg by mouth at bedtime. 06/26/16  Yes [provider]  traZODone (DESYREL) 150 MG tablet Take 300 mg by mouth at bedtime. 05/25/16  Yes [provider]  zolpidem (AMBIEN) 10 MG tablet Take 10 mg by mouth at bedtime as needed. 06/19/16  Yes [provider]  lisinopril (PRINIVIL,ZESTRIL) 10 MG  tablet Take 10 mg by mouth daily. 01/31/16 01/30/17  [provider]    Allergies Iodine, Morphine and related, Nsaids, and Sulfa antibiotics  No family history on file.  Social History Social History   Tobacco Use  . Smoking status: Current Every Day Smoker    Types: Cigars  . Smokeless tobacco: Never Used  Substance Use Topics  . Alcohol use: No  . Drug use: No    Review of Systems  Constitutional: Negative for fever. Eyes: Negative for visual changes. ENT: Negative for sore throat. Neck: No neck pain  Cardiovascular: Negative for chest pain. + indigestion and palpitations Respiratory: + shortness of breath. Gastrointestinal: Negative for abdominal pain, vomiting or diarrhea. Genitourinary: Negative for dysuria. + frequency Musculoskeletal: Negative for back pain. Skin: Negative for rash. Neurological: Negative for headaches, weakness or numbness. Psych: No SI or HI  ____________________________________________   PHYSICAL EXAM:  VITAL SIGNS: ED Triage Vitals  Enc Vitals Group     BP 04/16/20 0205 (!) 156/105     Pulse Rate 04/16/20 0205 (!) 124     Resp 04/16/20 0205 (!) 22     Temp 04/16/20 0205 99.2 F (37.3 C)     Temp Source 04/16/20 0205 Oral     SpO2 04/16/20 0205 96 %     Weight --      Height --      Head Circumference --      Peak Flow --      Pain Score 04/16/20 0204 7     Pain Loc --      Pain Edu? --      Excl. in GC? --     Constitutional: Alert and oriented. Well appearing and in no apparent distress. HEENT:      Head: Normocephalic and atraumatic.         Eyes: Conjunctivae are normal. Sclera is non-icteric.       Mouth/Throat: Mucous membranes are moist.       Neck: Supple with no signs of meningismus. Cardiovascular: Tachycardic with regular rhythm. Respiratory: Normal respiratory effort. Lungs are clear to auscultation bilaterally. No wheezes, crackles, or rhonchi.  Gastrointestinal: Soft, non tender. Musculoskeletal: No  edema, cyanosis, or erythema of extremities. Neurologic: Normal speech and language. Face is symmetric. Moving all extremities. No gross focal neurologic deficits are appreciated. Skin: Skin is warm, dry and intact. No rash noted. Psychiatric: Mood and affect are normal. Speech and behavior are normal.  ____________________________________________   LABS (all labs ordered are listed, but only abnormal results are displayed)  Labs Reviewed  CBC WITH DIFFERENTIAL/PLATELET - Abnormal; Notable for the following components:      Result Value   WBC 14.5 (*)    Neutro Abs 10.1 (*)    All other components within normal limits  COMPREHENSIVE METABOLIC PANEL - Abnormal; Notable for the following  components:   Potassium 3.1 (*)    Glucose, Bld 219 (*)    Calcium 8.6 (*)    All other components within normal limits  GLUCOSE, CAPILLARY - Abnormal; Notable for the following components:   Glucose-Capillary 212 (*)    All other components within normal limits  URINALYSIS, COMPLETE (UACMP) WITH MICROSCOPIC - Abnormal; Notable for the following components:   Color, Urine COLORLESS (*)    APPearance CLEAR (*)    Specific Gravity, Urine 1.001 (*)    Ketones, ur 5 (*)    All other components within normal limits  TROPONIN I (HIGH SENSITIVITY) - Abnormal; Notable for the following components:   Troponin I (High Sensitivity) 34 (*)    All other components within normal limits  TROPONIN I (HIGH SENSITIVITY) - Abnormal; Notable for the following components:   Troponin I (High Sensitivity) 89 (*)    All other components within normal limits  RESPIRATORY PANEL BY RT PCR (FLU A&B, COVID)  MAGNESIUM  URINE DRUG SCREEN, QUALITATIVE ([facility] ONLY)  CBG MONITORING, ED   ____________________________________________  EKG  ED ECG REPORT I, Nita Sickle, the attending physician, personally viewed and interpreted this ECG.  Sinus tachycardia, rate of 123, normal intervals, normal axis, mild diffuse ST  depressions with no ST elevation.  EKG is unchanged when compared to prior from 2018 ____________________________________________  RADIOLOGY  I have personally reviewed the images performed during this visit and I agree with the Radiologist's read.   Interpretation by Radiologist:  DG Chest Portable 1 View  Result Date: 04/16/2020 CLINICAL DATA:  Chest pain EXAM: PORTABLE CHEST 1 VIEW COMPARISON:  07/22/2016 FINDINGS: The heart size and mediastinal contours are within normal limits. Both lungs are clear. The visualized skeletal structures are unremarkable. IMPRESSION: No active disease. Electronically Signed   By: Deatra Robinson M.D.   On: 04/16/2020 02:28     ____________________________________________   PROCEDURES  Procedure(s) performed:yes .1-3 Lead EKG Interpretation Performed by: Nita Sickle, MD Authorized by: Nita Sickle, MD     Interpretation: non-specific     ECG rate assessment: tachycardic     Rhythm: sinus tachycardia     Ectopy: none     Critical Care performed: yes  CRITICAL CARE Performed by: Nita Sickle  ?  Total critical care time: 35 min  Critical care time was exclusive of separately billable procedures and treating other patients.  Critical care was necessary to treat or prevent imminent or life-threatening deterioration.  Critical care was time spent personally by me on the following activities: development of treatment plan with patient and/or surrogate as well as nursing, discussions with consultants, evaluation of patient's response to treatment, examination of patient, obtaining history from patient or surrogate, ordering and performing treatments and interventions, ordering and review of laboratory studies, ordering and review of radiographic studies, pulse oximetry and re-evaluation of patient's condition.  ____________________________________________   INITIAL IMPRESSION / ASSESSMENT AND PLAN / ED COURSE  64 y.o. male  history of chronic pain syndrome, diabetes, hypertension who presents for evaluation of palpitations, indigestion, urinary frequency, SVT now converted after adenosine given by EMS.  Patient arrives in sinus tachycardia with diffuse ST depressions on EKG which is unchanged when compared to prior from 2018.  No longer in SVT.  EKG from EMS reviewed showing SVT with a rate in the 180s.  Patient denies having any chest pain or shortness of breath at this time.  His vitals show no fever, tachycardia with a pulse of 124.  Due to  urinary frequency urinalysis was done showing no evidence of UTI.  Drug screen is negative.  Patient does have mild elevated white count with a left shift.  Denies any respiratory symptoms. CXR visualized by me with no signs of pneumonia or edema, confirmed by radiology.  Mild hypokalemia with potassium of 3.1 which is supplemented p.o.  Mild hyperglycemia with glucose of 219 with no evidence of DKA.  No other electrolyte derangements.  Initial troponin slightly elevated at 34.  Most likely demand ischemia in the setting of SVT.  Will repeat a second troponin. Will give IV fluids, check for Covid.  Old medical records reviewed showing an abnormal stress test in 2020 and normal echo with mild diastolic CHF done in January 2021.  Patient placed on telemetry for close monitoring.  _________________________ 5:36 AM on 04/16/2020 -----------------------------------------  Troponin trending up from 34-89.  Possibly demand ischemia in the setting of SVT however since patient did have some chest pain we will get him admitted to the hospitalist service for possible NSTEMI.  Patient remains chest pain-free at this time.  No indications for heparin.  Will give a full dose of aspirin.  Tachycardia improved after IV fluids.  Will get patient admitted to the hospitalist service.      _____________________________________________ Please note:  Patient was evaluated in Emergency Department today for  the symptoms described in the history of present illness. Patient was evaluated in the context of the global COVID-19 pandemic, which necessitated consideration that the patient might be at risk for infection with the SARS-CoV-2 virus that causes COVID-19. Institutional protocols and algorithms that pertain to the evaluation of patients at risk for COVID-19 are in a state of rapid change based on information released by regulatory bodies including the CDC and federal and state organizations. These policies and algorithms were followed during the patient's care in the ED.  Some ED evaluations and interventions may be delayed as a result of limited staffing during the pandemic.    Controlled Substance Database was reviewed by me. ____________________________________________   FINAL CLINICAL IMPRESSION(S) / ED DIAGNOSES   Final diagnoses:  SVT (supraventricular tachycardia) (HCC)  Demand ischemia (HCC)      NEW MEDICATIONS STARTED DURING THIS VISIT:  ED Discharge Orders    None       Note:  This document was prepared using Dragon voice recognition software and may include unintentional dictation errors.    Nita SickleVeronese, [place], MD 04/16/20 [phone]

## 2020-04-17 ENCOUNTER — Encounter: Payer: Self-pay | Admitting: Internal Medicine

## 2020-04-17 DIAGNOSIS — R778 Other specified abnormalities of plasma proteins: Secondary | ICD-10-CM | POA: Diagnosis not present

## 2020-04-17 DIAGNOSIS — I10 Essential (primary) hypertension: Secondary | ICD-10-CM | POA: Diagnosis not present

## 2020-04-17 DIAGNOSIS — I471 Supraventricular tachycardia: Secondary | ICD-10-CM | POA: Diagnosis not present

## 2020-04-17 LAB — LIPID PANEL
Cholesterol: 131 mg/dL (ref 0–200)
HDL: 28 mg/dL — ABNORMAL LOW (ref >40)
LDL Cholesterol: 79 mg/dL (ref 0–99)
Total CHOL/HDL Ratio: 4.7 RATIO
Triglycerides: 118 mg/dL (ref <150)
VLDL: 24 mg/dL (ref 0–40)

## 2020-04-17 LAB — CBC
HCT: 39.1 % (ref 39.0–52.0)
Hemoglobin: 13.2 g/dL (ref 13.0–17.0)
MCH: 30.5 pg (ref 26.0–34.0)
MCHC: 33.8 g/dL (ref 30.0–36.0)
MCV: 90.3 fL (ref 80.0–100.0)
Platelets: 258 10*3/uL (ref 150–400)
RBC: 4.33 MIL/uL (ref 4.22–5.81)
RDW: 12.4 % (ref 11.5–15.5)
WBC: 11.4 10*3/uL — ABNORMAL HIGH (ref 4.0–10.5)
nRBC: 0 % (ref 0.0–0.2)

## 2020-04-17 LAB — GLUCOSE, CAPILLARY
Glucose-Capillary: 150 mg/dL — ABNORMAL HIGH (ref 70–99)
Glucose-Capillary: 159 mg/dL — ABNORMAL HIGH (ref 70–99)
Glucose-Capillary: 100 mg/dL — ABNORMAL HIGH (ref 70–99)
Glucose-Capillary: 153 mg/dL — ABNORMAL HIGH (ref 70–99)

## 2020-04-17 LAB — BASIC METABOLIC PANEL
Anion gap: 9 (ref 5–15)
BUN: 13 mg/dL (ref 8–23)
CO2: 25 mmol/L (ref 22–32)
Calcium: 8.6 mg/dL — ABNORMAL LOW (ref 8.9–10.3)
Chloride: 104 mmol/L (ref 98–111)
Creatinine, Ser: 0.77 mg/dL (ref 0.61–1.24)
GFR, Estimated: >60 mL/min (ref >60)
Glucose, Bld: 146 mg/dL — ABNORMAL HIGH (ref 70–99)
Potassium: 3.6 mmol/L (ref 3.5–5.1)
Sodium: 138 mmol/L (ref 135–145)

## 2020-04-17 LAB — MAGNESIUM: Magnesium: 2.2 mg/dL (ref 1.7–2.4)

## 2020-04-17 MED ORDER — ENOXAPARIN SODIUM 80 MG/0.8ML ~~LOC~~ SOLN
0.5000 mg/kg | SUBCUTANEOUS | Status: DC
  Administered 2020-04-17 – 2020-04-18 (×2): 62.5 mg via SUBCUTANEOUS
  Filled 2020-04-17 (×2): qty 0.8

## 2020-04-17 MED ORDER — ENOXAPARIN SODIUM 60 MG/0.6ML ~~LOC~~ SOLN
0.5000 mg/kg | SUBCUTANEOUS | Status: DC
  Filled 2020-04-17: qty 1.2

## 2020-04-17 MED ORDER — POTASSIUM CHLORIDE CRYS ER 20 MEQ PO TBCR
40.0000 meq | EXTENDED_RELEASE_TABLET | Freq: Once | ORAL | Status: AC
  Administered 2020-04-17: 40 meq via ORAL
  Filled 2020-04-17: qty 2

## 2020-04-17 NOTE — Progress Notes (Addendum)
Progress Note    Shane Mercado  ONG:[id number] DOB: 12-18-1955  DOA: 04/16/2020 PCP: Bernerd Limbo, MD      Brief Narrative:    Medical records reviewed and are as summarized below:  Shane Mercado is a 64 y.o. male       Assessment/Plan:   Principal Problem:   SVT (supraventricular tachycardia) (HCC) Active Problems:   Elevated troponin   Diabetes mellitus without complication (HCC)   Hypertension   HLD (hyperlipidemia)   GERD (gastroesophageal reflux disease)   Depression   Chronic pain   Tobacco abuse   Hypokalemia   Body mass index is 37.1 kg/m.  (Obesity)   SVT: Converted to normal sinus rhythm after treatment with adenosine.  TSH is normal.  2D echo showed EF estimated at 55 to 60%, grade 1 diastolic dysfunction.  He has been started on sotalol.  Monitor QTC.  Awaiting further recommendations from cardiologist regarding disposition.  Elevated troponin: This is likely from demand ischemia from tachycardia.  Hypokalemia: Replete potassium  Grade 1 diastolic dysfunction, hypertension: Continue antihypertensives  GERD, hyperlipidemia, depression, chronic back pain: Continue home medications   Diet Order            Diet heart healthy/carb modified Room service appropriate? Yes; Fluid consistency: Thin  Diet effective now                    Consultants:  Cardiologist  Procedures:  None    Medications:   . aspirin EC  81 mg Oral Daily  . atorvastatin  40 mg Oral QHS  . cholecalciferol  1,000 Units Oral Daily  . DULoxetine  30 mg Oral BID  . enoxaparin (LOVENOX) injection  0.5 mg/kg Subcutaneous Q24H  . gabapentin  900 mg Oral BID  . insulin aspart  0-5 Units Subcutaneous QHS  . insulin aspart  0-9 Units Subcutaneous TID WC  . lisinopril  10 mg Oral Daily  . nicotine  21 mg Transdermal Daily  . oxyCODONE  40 mg Oral Q12H  . pantoprazole  40 mg Oral Daily  . primidone  50 mg Oral Daily  . sotalol  80 mg Oral Daily  .  traZODone  300 mg Oral QHS   Continuous Infusions:   Anti-infectives (From admission, onward)   None             Family Communication/Anticipated D/C date and plan/Code Status   DVT prophylaxis:      Code Status: Full Code  Family Communication: Plan discussed with patient Disposition Plan:    Status is: Observation  The patient will require care spanning > 2 midnights and should be moved to inpatient because: QTC monitoring after initiation of sotalol  Dispo: The patient is from: Home              Anticipated d/c is to: Home              Anticipated d/c date is: 1 day              Patient currently is not medically stable to d/c.           Subjective:   No chest pain, shortness of breath, dizziness or palpitations.  Objective:    Vitals:   04/17/20 0424 04/17/20 0700 04/17/20 0737 04/17/20 1154  BP: (!) 149/66  139/68 (!) 149/76  Pulse: 73  77 71  Resp: Temp: 98.6 F (37 C)  98.3  F (36.8 C) 98.7 F (37.1 C)  TempSrc: Oral  Oral Oral  SpO2: 92%  97% 96%  Weight:      Height:  6' 0.01" (1.829 m)     No data found.   Intake/Output Summary (Last 24 hours) at 04/17/2020 1238 Last data filed at 04/17/2020 1100 Gross per 24 hour  Intake --  Output 1275 ml  Net -1275 ml   Filed Weights   04/16/20 1308 04/17/20 0207  Weight: 129.3 kg 124.1 kg    Exam:  GEN: NAD SKIN: No rash EYES: EOMI ENT: MMM CV: RRR PULM: CTA B ABD: soft, obese, NT, +BS CNS: AAO x 3, non focal EXT: No edema or tenderness   Data Reviewed:   I have personally reviewed following labs and imaging studies:  Labs: Labs show the following:   Basic Metabolic Panel: Recent Labs  Lab 04/16/20 0212 04/17/20 0445  NA 140 138  K 3.1* 3.6  CL 104 104  CO2 24 25  GLUCOSE 219* 146*  BUN 14 13  CREATININE 0.88 0.77  CALCIUM 8.6* 8.6*  MG 1.9 2.2   GFR Estimated Creatinine Clearance: 128.6 mL/min (by C-G formula based on SCr of 0.77  mg/dL). Liver Function Tests: Recent Labs  Lab 04/16/20 0212  AST 22  ALT 18  ALKPHOS 63  BILITOT 1.0  PROT 7.5  ALBUMIN 4.3   No results for input(s): LIPASE, AMYLASE in the last 168 hours. No results for input(s): AMMONIA in the last 168 hours. Coagulation profile No results for input(s): INR, PROTIME in the last 168 hours.  CBC: Recent Labs  Lab 04/16/20 0212 04/17/20 0445  WBC 14.5* 11.4*  NEUTROABS 10.1*  --   HGB 14.8 13.2  HCT 42.6 39.1  MCV 88.8 90.3  PLT 324 258   Cardiac Enzymes: No results for input(s): CKTOTAL, CKMB, CKMBINDEX, TROPONINI in the last 168 hours. BNP (last 3 results) No results for input(s): PROBNP in the last 8760 hours. CBG: Recent Labs  Lab 04/16/20 1154 04/16/20 1601 04/16/20 2055 04/17/20 0738 04/17/20 1156  GLUCAP 131* 164* 130* 150* 159*   D-Dimer: No results for input(s): DDIMER in the last 72 hours. Hgb A1c: Recent Labs    04/16/20 0737  HGBA1C 6.7*   Lipid Profile: Recent Labs    04/17/20 0445  CHOL 131  HDL 28*  LDLCALC 79  TRIG 852  CHOLHDL 4.7   Thyroid function studies: Recent Labs    04/16/20 0737  TSH 1.228   Anemia work up: No results for input(s): VITAMINB12, FOLATE, FERRITIN, TIBC, IRON, RETICCTPCT in the last 72 hours. Sepsis Labs: Recent Labs  Lab 04/16/20 0212 04/17/20 0445  WBC 14.5* 11.4*    Microbiology Recent Results (from the past 240 hour(s))  Respiratory Panel by RT PCR (Flu A&B, Covid) - Nasopharyngeal Swab     Status: None   Collection Time: 04/16/20  2:49 AM   Specimen: Nasopharyngeal Swab  Result Value Ref Range Status   SARS Coronavirus 2 by RT PCR NEGATIVE NEGATIVE Final    Comment: (NOTE) SARS-CoV-2 target nucleic acids are NOT DETECTED.  The SARS-CoV-2 RNA is generally detectable in upper respiratoy specimens during the acute phase of infection. The lowest concentration of SARS-CoV-2 viral copies this assay can detect is 131 copies/mL. A negative result does not  preclude SARS-Cov-2 infection and should not be used as the sole basis for treatment or other patient management decisions. A negative result may occur with  improper specimen collection/handling, submission of  specimen other than nasopharyngeal swab, presence of viral mutation(s) within the areas targeted by this assay, and inadequate number of viral copies (<131 copies/mL). A negative result must be combined with clinical observations, patient history, and epidemiological information. The expected result is Negative.  Fact Sheet for Patients:  https://www.moore.com/https://www.fda.gov/media/142436/download  Fact Sheet for Healthcare Providers:  https://www.young.biz/https://www.fda.gov/media/142435/download  This test is no t yet approved or cleared by the [place] FDA and  has been authorized for detection and/or diagnosis of SARS-CoV-2 by FDA under an Emergency Use Authorization (EUA). This EUA will remain  in effect (meaning this test can be used) for the duration of the COVID-19 declaration under Section 564(b)(1) of the Act, 21 U.S.C. section 360bbb-3(b)(1), unless the authorization is terminated or revoked sooner.     Influenza A by PCR NEGATIVE NEGATIVE Final   Influenza B by PCR NEGATIVE NEGATIVE Final    Comment: (NOTE) The Xpert Xpress SARS-CoV-2/FLU/RSV assay is intended as an aid in  the diagnosis of influenza from Nasopharyngeal swab specimens and  should not be used as a sole basis for treatment. Nasal washings and  aspirates are unacceptable for Xpert Xpress SARS-CoV-2/FLU/RSV  testing.  Fact Sheet for Patients: https://www.moore.com/https://www.fda.gov/media/142436/download  Fact Sheet for Healthcare Providers: https://www.young.biz/https://www.fda.gov/media/142435/download  This test is not yet approved or cleared by the [place] FDA and  has been authorized for detection and/or diagnosis of SARS-CoV-2 by  FDA under an Emergency Use Authorization (EUA). This EUA will remain  in effect (meaning this test can be used) for the  duration of the  Covid-19 declaration under Section 564(b)(1) of the Act, 21  U.S.C. section 360bbb-3(b)(1), unless the authorization is  terminated or revoked. Performed at [facility], [address] [place], [place] [address]     Procedures and diagnostic studies:  DG Chest Portable 1 View  Result Date: 04/16/2020 CLINICAL DATA:  Chest pain EXAM: PORTABLE CHEST 1 VIEW COMPARISON:  07/22/2016 FINDINGS: The heart size and mediastinal contours are within normal limits. Both lungs are clear. The visualized skeletal structures are unremarkable. IMPRESSION: No active disease. Electronically Signed   By: Deatra RobinsonKevin  Herman M.D.   On: 04/16/2020 02:28   ECHOCARDIOGRAM COMPLETE  Result Date: 04/16/2020    ECHOCARDIOGRAM REPORT   Patient Name:   Shane LangtonCLIFFORD L Mercado Date of Exam: 04/16/2020 Medical Rec #:  [id number]        Height:       72.0 in Accession #:    [id number]       Weight:       280.0 lb Date of Birth:  1956/06/17        BSA:          2.458 m Patient Age:    64 years         BP:           158/87 mmHg Patient Gender: M                HR:           86 bpm. Exam Location:  [facility] Procedure: 2D Echo, Cardiac Doppler and Color Doppler Indications:     Elevated Troponin  History:         Patient has no prior history of Echocardiogram examinations.                  Risk Factors:Hypertension and Diabetes. Renal disorder.  Sonographer:     Cristela BlueJerry Hege RDCS (AE) Referring Phys:  Kern [id number] Brien FewXILIN NIU Diagnosing Phys: Wynelle LinkShaukat  Welton Flakes MD IMPRESSIONS  1. Left ventricular ejection fraction, by estimation, is 55 to 60%. The left ventricle has normal function. The left ventricle has no regional wall motion abnormalities. Left ventricular diastolic parameters are consistent with Grade I diastolic dysfunction (impaired relaxation).  2. Right ventricular systolic function is normal. The right ventricular size is normal.  3. Left atrial size was mildly dilated.  4. The mitral valve is normal in structure. Trivial mitral  valve regurgitation. No evidence of mitral stenosis.  5. The aortic valve is normal in structure. Aortic valve regurgitation is not visualized. No aortic stenosis is present.  6. The inferior vena cava is normal in size with greater than 50% respiratory variability, suggesting right atrial pressure of 3 mmHg. FINDINGS  Left Ventricle: Left ventricular ejection fraction, by estimation, is 55 to 60%. The left ventricle has normal function. The left ventricle has no regional wall motion abnormalities. The left ventricular internal cavity size was normal in size. There is  no left ventricular hypertrophy. Left ventricular diastolic parameters are consistent with Grade I diastolic dysfunction (impaired relaxation). Right Ventricle: The right ventricular size is normal. No increase in right ventricular wall thickness. Right ventricular systolic function is normal. Left Atrium: Left atrial size was mildly dilated. Right Atrium: Right atrial size was normal in size. Pericardium: There is no evidence of pericardial effusion. Mitral Valve: The mitral valve is normal in structure. Trivial mitral valve regurgitation. No evidence of mitral valve stenosis. Tricuspid Valve: The tricuspid valve is normal in structure. Tricuspid valve regurgitation is trivial. No evidence of tricuspid stenosis. Aortic Valve: The aortic valve is normal in structure. Aortic valve regurgitation is not visualized. No aortic stenosis is present. Aortic valve mean gradient measures 2.0 mmHg. Aortic valve peak gradient measures 3.8 mmHg. Aortic valve area, by VTI measures 4.03 cm. Pulmonic Valve: The pulmonic valve was normal in structure. Pulmonic valve regurgitation is trivial. No evidence of pulmonic stenosis. Aorta: The aortic root is normal in size and structure. Venous: The inferior vena cava is normal in size with greater than 50% respiratory variability, suggesting right atrial pressure of 3 mmHg. IAS/Shunts: No atrial level shunt detected by  color flow Doppler.  LEFT VENTRICLE PLAX 2D LVIDd:         4.61 cm  Diastology LVIDs:         3.19 cm  LV e' medial:    4.79 cm/s LV PW:         1.42 cm  LV E/e' medial:  10.2 LV IVS:        1.14 cm  LV e' lateral:   6.20 cm/s LVOT diam:     2.30 cm  LV E/e' lateral: 7.9 LV SV:         91 LV SV Index:   37 LVOT Area:     4.15 cm  RIGHT VENTRICLE RV Basal diam:  2.84 cm RV S prime:     13.60 cm/s TAPSE (M-mode): 3.8 cm LEFT ATRIUM             Index       RIGHT ATRIUM           Index LA diam:        4.10 cm 1.67 cm/m  RA Area:     19.80 cm LA Vol (A2C):   85.4 ml 34.74 ml/m RA Volume:   54.90 ml  22.33 ml/m LA Vol (A4C):   32.0 ml 13.02 ml/m LA Biplane Vol: 52.6 ml 21.40 ml/m  AORTIC VALVE AV Area (Vmax):    4.02 cm AV Area (Vmean):   3.95 cm AV Area (VTI):     4.03 cm AV Vmax:           96.85 cm/s AV Vmean:          70.950 cm/s AV VTI:            0.224 m AV Peak Grad:      3.8 mmHg AV Mean Grad:      2.0 mmHg LVOT Vmax:         93.80 cm/s LVOT Vmean:        67.400 cm/s LVOT VTI:          0.218 m LVOT/AV VTI ratio: 0.97  AORTA Ao Root diam: 3.10 cm MITRAL VALVE               TRICUSPID VALVE MV Area (PHT): 3.34 cm    TR Peak grad:   7.8 mmHg MV Decel Time: 227 msec    TR Vmax:        140.00 cm/s MV E velocity: 49.00 cm/s MV A velocity: 69.40 cm/s  SHUNTS MV E/A ratio:  0.71        Systemic VTI:  0.22 m                            Systemic Diam: 2.30 cm Shane Blackwater MD Electronically signed by Shane Blackwater MD Signature Date/Time: 04/16/2020/2:04:09 PM    Final                LOS: 0 days   Beyonka Pitney  [facility]   Pager on [email or web address]. If 7PM-7AM, please contact night-coverage at [email or web address]     04/17/2020, 12:38 PM

## 2020-04-17 NOTE — Progress Notes (Signed)
SUBJECTIVE: Patient resting comfortably in bed.  Denies chest pain, shortness of breath, or palpitations at this time.  His only complaint is that he has back pain.   Vitals:   04/17/20 0207 04/17/20 0424 04/17/20 0700 04/17/20 0737  BP:  (!) 149/66  139/68  Pulse:  73  77  Resp:  16  16  Temp:  98.6 F (37 C)  98.3 F (36.8 C)  TempSrc:  Oral  Oral  SpO2:  92%  97%  Weight: 124.1 kg     Height:   6' 0.01" (1.829 m)     Intake/Output Summary (Last 24 hours) at 04/17/2020 1013 Last data filed at 04/17/2020 0423 Gross per 24 hour  Intake --  Output 1075 ml  Net -1075 ml    LABS: Basic Metabolic Panel: Recent Labs    04/16/20 0212 04/17/20 0445  NA 140 138  K 3.1* 3.6  CL 104 104  CO2 24 25  GLUCOSE 219* 146*  BUN 14 13  CREATININE 0.88 0.77  CALCIUM 8.6* 8.6*  MG 1.9 2.2   Liver Function Tests: Recent Labs    04/16/20 0212  AST 22  ALT 18  ALKPHOS 63  BILITOT 1.0  PROT 7.5  ALBUMIN 4.3   No results for input(s): LIPASE, AMYLASE in the last 72 hours. CBC: Recent Labs    04/16/20 0212 04/17/20 0445  WBC 14.5* 11.4*  NEUTROABS 10.1*  --   HGB 14.8 13.2  HCT 42.6 39.1  MCV 88.8 90.3  PLT 324 258   Cardiac Enzymes: No results for input(s): CKTOTAL, CKMB, CKMBINDEX, TROPONINI in the last 72 hours. BNP: Invalid input(s): POCBNP D-Dimer: No results for input(s): DDIMER in the last 72 hours. Hemoglobin A1C: Recent Labs    04/16/20 0737  HGBA1C 6.7*   Fasting Lipid Panel: Recent Labs    04/17/20 0445  CHOL 131  HDL 28*  LDLCALC 79  TRIG 287  CHOLHDL 4.7   Thyroid Function Tests: Recent Labs    04/16/20 0737  TSH 1.228   Anemia Panel: No results for input(s): VITAMINB12, FOLATE, FERRITIN, TIBC, IRON, RETICCTPCT in the last 72 hours.   PHYSICAL EXAM General: Well developed, well nourished, in no acute distress HEENT:  Normocephalic and atramatic Neck:  No JVD.  Lungs: Clear bilaterally to auscultation and percussion. Heart: HRRR  . Normal S1 and S2 without gallops or murmurs.  Abdomen: Bowel sounds are positive, abdomen soft and non-tender  Msk:  Back normal, normal gait. Normal strength and tone for age. Extremities: No clubbing, cyanosis or edema.   Neuro: Alert and oriented X 3. Psych:  Good affect, responds appropriately  TELEMETRY: NSR.  76/BPM  ASSESSMENT AND PLAN: Patient presenting to the emergency department due to SVT which resolved with adenosine.  Patient remains in normal sinus rhythm.  Echocardiogram revealed ejection fraction 55-60% with grade 1 diastolic dysfunction and no other significant structural abnormalities.  Patient was started on sotalol 80 mg daily yesterday.  Awaiting repeat EKG this morning to ensure appropriate QTc.  We will continue to follow  Principal Problem:   SVT (supraventricular tachycardia) (HCC) Active Problems:   Elevated troponin   Diabetes mellitus without complication (HCC)   Hypertension   HLD (hyperlipidemia)   GERD (gastroesophageal reflux disease)   Depression   Chronic pain   Tobacco abuse   Hypokalemia    Maryelizabeth Kaufmann, NP-C 04/17/2020 10:13 AM

## 2020-04-17 NOTE — Plan of Care (Signed)

## 2020-04-17 NOTE — Progress Notes (Signed)
PHARMACIST - PHYSICIAN COMMUNICATION  CONCERNING:  Enoxaparin (Lovenox) for DVT Prophylaxis    RECOMMENDATION: Patient was prescribed enoxaprin 40mg  q24 hours for VTE prophylaxis.   Filed Weights   04/16/20 1308 04/17/20 0207  Weight: 129.3 kg (285 lb) 124.1 kg (273 lb 9.5 oz)    Body mass index is 37.1 kg/m.  Estimated Creatinine Clearance: 128.6 mL/min (by C-G formula based on SCr of 0.77 mg/dL).   Based on [facility] policy patient is candidate for enoxaparin 0.5mg /kg TBW SQ every 24 hours based on BMI being >30.  DESCRIPTION: Pharmacy has adjusted enoxaparin dose per [facility] policy.  Patient is now receiving enoxaparin 62.5 mg every 24 hours    [facility], PharmD Clinical Pharmacist  04/17/2020 7:18 AM

## 2020-04-18 DIAGNOSIS — R778 Other specified abnormalities of plasma proteins: Secondary | ICD-10-CM | POA: Diagnosis not present

## 2020-04-18 DIAGNOSIS — Z5181 Encounter for therapeutic drug level monitoring: Secondary | ICD-10-CM | POA: Diagnosis not present

## 2020-04-18 DIAGNOSIS — I471 Supraventricular tachycardia: Secondary | ICD-10-CM | POA: Diagnosis not present

## 2020-04-18 DIAGNOSIS — E876 Hypokalemia: Secondary | ICD-10-CM | POA: Diagnosis not present

## 2020-04-18 LAB — GLUCOSE, CAPILLARY
Glucose-Capillary: 167 mg/dL — ABNORMAL HIGH (ref 70–99)
Glucose-Capillary: 140 mg/dL — ABNORMAL HIGH (ref 70–99)

## 2020-04-18 MED ORDER — SOTALOL HCL 80 MG PO TABS: 80.0000 mg | ORAL_TABLET | Freq: Every day | ORAL | 0 refills | Status: AC

## 2020-04-18 NOTE — Discharge Instructions (Signed)
Supraventricular Tachycardia, Adult Supraventricular tachycardia (SVT) is a kind of abnormal heartbeat. It makes your heart beat very fast and then beat at a normal speed. A normal resting heartbeat is 60-100 times a minute. This condition can make your heart beat more than 150 times a minute. Times of having a fast heartbeat (episodes) can be scary, but they are usually not dangerous. In some cases, they may lead to heart failure if:  They happen many times per day.  Last longer than a few seconds. What are the causes?  A normal heartbeat starts when an area called the sinoatrial node sends out an electrical signal. In SVT, other areas of the heart send out signals that get in the way of the signal from the sinoatrial node. What increases the risk? You are more likely to develop this condition if you are:  12-30 years old.  A woman. The following factors may make you more likely to develop this condition:  Stress.  Tiredness.  Smoking.  Stimulant drugs, such as cocaine and methamphetamine.  Alcohol.  Caffeine.  Pregnancy.  Feeling worried or nervous (anxiety). What are the signs or symptoms?  A pounding heart.  A feeling that your heart is skipping beats (palpitations).  Weakness.  Trouble getting enough air.  Pain or tightness in your chest.  Feeling like you are going to pass out (faint).  Feeling worried or nervous.  Dizziness.  Sweating.  Feeling sick to your stomach (nausea).  Passing out.  Tiredness. Sometimes, there are no symptoms. How is this treated?  Vagal nerve stimulation. Ways to do this include: ? Holding your breath and pushing, as though you are pooping (having a bowel movement). ? Massaging an area on one side of your neck. Do not try this yourself. Only a doctor should do this. If done the wrong way, it can lead to a stroke. ? Bending forward with your head between your legs. ? Coughing while bending forward with your head between  your legs. ? Closing your eyes and massaging your eyeballs. Ask a doctor how to do this.  Medicines that prevent attacks.  Medicine to stop an attack given through an IV tube at the hospital.  A small electric shock (cardioversion) that stops an attack.  Radiofrequency ablation. In this procedure, a small, thin tube (catheter) is used to send energy to the area that is causing the rapid heartbeats. If you do not have symptoms, you may not need treatment. Follow these instructions at home: Stress  Avoid things that make you feel stressed.  To deal with stress, try: ? Doing yoga or meditation, or being out in nature. ? Listening to relaxing music. ? Doing deep breathing. ? Taking steps to be healthy, such as getting lots of sleep, exercising, and eating a balanced diet. ? Talking with a mental health doctor. Lifestyle   Try to get at least 7 hours of sleep each night.  Do not use any products that contain nicotine or tobacco, such as cigarettes, e-cigarettes, and chewing tobacco. If you need help quitting, ask your doctor.  Be aware of how alcohol affects you. ? If alcohol gives you a fast heartbeat, do not drink alcohol. ? If alcohol does not seem to give you a fast heartbeat, limit alcohol use to no more than 1 drink a day for women who are not pregnant, and 2 drinks a day for men. In the [place] one drink is one of these:  12 oz of beer (355 mL).  5   oz of wine (148 mL).  1 oz of hard liquor (44 mL).  Be aware of how caffeine affects you. ? If caffeine gives you a fast heartbeat, do not eat, drink, or use anything with caffeine in it. ? If caffeine does not seem to give you a fast heartbeat, limit how much caffeine you eat, drink, or use.  Do not use stimulant drugs. If you need help quitting, ask your doctor. General instructions  Stay at a healthy weight.  Exercise regularly. Ask your doctor about good activities for you. Try one or a mixture of these: ? 150 minutes  a week of gentle exercise, like walking or yoga. ? 75 minutes a week of exercise that is very active, like running or swimming.  Do vagus nerve treatments to slow down your heartbeat as told by your doctor.  Take over-the-counter and prescription medicines only as told by your doctor.  Keep all follow-up visits as told by your doctor. This is important. Contact a doctor if:  You have a fast heartbeat more often.  Times of having a fast heartbeat last longer than before.  Home treatments to slow down your heartbeat do not help.  You have new symptoms. Get help right away if:  You have chest pain.  Your symptoms get worse.  You have trouble breathing.  Your heart beats very fast for more than 20 minutes.  You pass out. These symptoms may be an emergency. Do not wait to see if the symptoms will go away. Get medical help right away. Call your local emergency services (911 in the [place] Do not drive yourself to the hospital. Summary  SVT is a type of abnormal heart beat.  This condition can make your heart beat more than 150 times a minute.  Treatment depends on how often the condition happens and your symptoms. This information is not intended to replace advice given to you by your health care provider. Make sure you discuss any questions you have with your health care provider. Document Revised: 04/26/2018 Document Reviewed: 04/26/2018 Elsevier Patient Education  2020 [employer]  

## 2020-04-18 NOTE — Plan of Care (Signed)

## 2020-04-18 NOTE — Plan of Care (Signed)
  Problem: Education: Goal: Knowledge of General Education information will improve Description: Including pain rating scale, medication(s)/side effects and non-pharmacologic comfort measures 04/18/2020 1214 by Ulis Rias, RN Outcome: Adequate for Discharge 04/18/2020 1028 by Ulis Rias, RN Outcome: Progressing   Problem: Health Behavior/Discharge Planning: Goal: Ability to manage health-related needs will improve 04/18/2020 1214 by Ulis Rias, RN Outcome: Adequate for Discharge 04/18/2020 1028 by Ulis Rias, RN Outcome: Progressing   Problem: Clinical Measurements: Goal: Ability to maintain clinical measurements within normal limits will improve 04/18/2020 1214 by Ulis Rias, RN Outcome: Adequate for Discharge 04/18/2020 1028 by Ulis Rias, RN Outcome: Progressing Goal: Will remain free from infection 04/18/2020 1214 by Ulis Rias, RN Outcome: Adequate for Discharge 04/18/2020 1028 by Ulis Rias, RN Outcome: Progressing Goal: Diagnostic test results will improve 04/18/2020 1214 by Ulis Rias, RN Outcome: Adequate for Discharge 04/18/2020 1028 by Ulis Rias, RN Outcome: Progressing Goal: Respiratory complications will improve 04/18/2020 1214 by Ulis Rias, RN Outcome: Adequate for Discharge 04/18/2020 1028 by Ulis Rias, RN Outcome: Progressing Goal: Cardiovascular complication will be avoided 04/18/2020 1214 by Ulis Rias, RN Outcome: Adequate for Discharge 04/18/2020 1028 by Ulis Rias, RN Outcome: Progressing   Problem: Activity: Goal: Risk for activity intolerance will decrease 04/18/2020 1214 by Ulis Rias, RN Outcome: Adequate for Discharge 04/18/2020 1028 by Ulis Rias, RN Outcome: Progressing   Problem: Nutrition: Goal: Adequate nutrition will be maintained 04/18/2020 1214 by Ulis Rias, RN Outcome: Adequate for Discharge 04/18/2020 1028 by Ulis Rias, RN Outcome: Progressing   Problem: Coping: Goal: Level of anxiety  will decrease 04/18/2020 1214 by Ulis Rias, RN Outcome: Adequate for Discharge 04/18/2020 1028 by Ulis Rias, RN Outcome: Progressing   Problem: Elimination: Goal: Will not experience complications related to bowel motility 04/18/2020 1214 by Ulis Rias, RN Outcome: Adequate for Discharge 04/18/2020 1028 by Ulis Rias, RN Outcome: Progressing Goal: Will not experience complications related to urinary retention 04/18/2020 1214 by Ulis Rias, RN Outcome: Adequate for Discharge 04/18/2020 1028 by Ulis Rias, RN Outcome: Progressing   Problem: Pain Managment: Goal: General experience of comfort will improve 04/18/2020 1214 by Ulis Rias, RN Outcome: Adequate for Discharge 04/18/2020 1028 by Ulis Rias, RN Outcome: Progressing   Problem: Safety: Goal: Ability to remain free from injury will improve 04/18/2020 1214 by Ulis Rias, RN Outcome: Adequate for Discharge 04/18/2020 1028 by Ulis Rias, RN Outcome: Progressing   Problem: Skin Integrity: Goal: Risk for impaired skin integrity will decrease 04/18/2020 1214 by Ulis Rias, RN Outcome: Adequate for Discharge 04/18/2020 1028 by Ulis Rias, RN Outcome: Progressing

## 2020-04-18 NOTE — Discharge Summary (Signed)
Physician Discharge Summary  Shane Mercado PXT:[id number] DOB: 1956/02/26 DOA: 04/16/2020  PCP: Bernerd Limbo, MD  Admit date: 04/16/2020 Discharge date: 04/18/2020  Discharge disposition: Home   Recommendations for Outpatient Follow-Up:   Follow-up with cardiologist in 1 week   Discharge Diagnosis:   Principal Problem:   SVT (supraventricular tachycardia) (HCC) Active Problems:   Elevated troponin   Diabetes mellitus without complication (HCC)   Hypertension   HLD (hyperlipidemia)   GERD (gastroesophageal reflux disease)   Depression   Chronic pain   Tobacco abuse   Hypokalemia    Discharge Condition: Stable.  Diet recommendation:  Diet Order            Diet - low sodium heart healthy           Diet heart healthy/carb modified Room service appropriate? Yes; Fluid consistency: Thin  Diet effective now                   Code Status: Full Code     Hospital Course:   Shane Mercado is a 64 year old man with medical history significant for morbid obesity, hypertension, hyperlipidemia, diabetes mellitus, GERD, depression, chronic back pain, tobacco use disorder.  He presented to the hospital with palpitation associated with indigestion, difficulty breathing and clammy skin.  Reportedly, when EMS arrived, he was found to have SVT with heart rate in the 180s.  He was given 2 doses of adenosine by EMS with subsequent conversion to normal sinus rhythm.  He was admitted to the hospital for observation.  He was seen in consultation by the cardiologist.  2D echo showed EF estimated at 55 to 60% and grade 1 diastolic dysfunction.  He was started on sotalol and QTc was monitored in the hospital.  He had hypokalemia that was repleted.  His troponins were slightly elevated but this was likely due to demand ischemia.  His condition has improved and he is deemed stable for discharge to home.  Medical Consultants:    Cardiologist   Discharge Exam:     Vitals:   04/18/20 0415 04/18/20 0416 04/18/20 0750 04/18/20 1056  BP:  (!) 142/67 133/75 (!) 105/47  Pulse:  68 84 80  Resp:  20 17 17   Temp:  98 F (36.7 C) 97.9 F (36.6 C) 98 F (36.7 C)  TempSrc:   Oral Oral  SpO2:  96% 98% 95%  Weight: 123.4 kg     Height:         GEN: NAD SKIN: No rash EYES: EOMI ENT: MMM CV: RRR PULM: CTA B ABD: soft, obese, NT, +BS CNS: AAO x 3, non focal EXT: No edema or tenderness   The results of significant diagnostics from this hospitalization (including imaging, microbiology, ancillary and laboratory) are listed below for reference.     Procedures and Diagnostic Studies:   DG Chest Portable 1 View  Result Date: 04/16/2020 CLINICAL DATA:  Chest pain EXAM: PORTABLE CHEST 1 VIEW COMPARISON:  07/22/2016 FINDINGS: The heart size and mediastinal contours are within normal limits. Both lungs are clear. The visualized skeletal structures are unremarkable. IMPRESSION: No active disease. Electronically Signed   By: 07/24/2016 M.D.   On: 04/16/2020 02:28   ECHOCARDIOGRAM COMPLETE  Result Date: 04/16/2020    ECHOCARDIOGRAM REPORT   Patient Name:   Shane Mercado Date of Exam: 04/16/2020 Medical Rec #:  04/18/2020        Height:       72.0 in Accession #:  [id number]       Weight:       280.0 lb Date of Birth:  [birth year]        BSA:          2.458 m Patient Age:    64 years         BP:           158/87 mmHg Patient Gender: M                HR:           86 bpm. Exam Location:  [facility] Procedure: 2D Echo, Cardiac Doppler and Color Doppler Indications:     Elevated Troponin  History:         Patient has no prior history of Echocardiogram examinations.                  Risk Factors:Hypertension and Diabetes. Renal disorder.  Sonographer:     Cristela Blue RDCS (AE) Referring Phys:  [id number] Brien Few NIU Diagnosing Phys: Adrian Blackwater MD IMPRESSIONS  1. Left ventricular ejection fraction, by estimation, is 55 to 60%. The left ventricle has normal function. The  left ventricle has no regional wall motion abnormalities. Left ventricular diastolic parameters are consistent with Grade I diastolic dysfunction (impaired relaxation).  2. Right ventricular systolic function is normal. The right ventricular size is normal.  3. Left atrial size was mildly dilated.  4. The mitral valve is normal in structure. Trivial mitral valve regurgitation. No evidence of mitral stenosis.  5. The aortic valve is normal in structure. Aortic valve regurgitation is not visualized. No aortic stenosis is present.  6. The inferior vena cava is normal in size with greater than 50% respiratory variability, suggesting right atrial pressure of 3 mmHg. FINDINGS  Left Ventricle: Left ventricular ejection fraction, by estimation, is 55 to 60%. The left ventricle has normal function. The left ventricle has no regional wall motion abnormalities. The left ventricular internal cavity size was normal in size. There is  no left ventricular hypertrophy. Left ventricular diastolic parameters are consistent with Grade I diastolic dysfunction (impaired relaxation). Right Ventricle: The right ventricular size is normal. No increase in right ventricular wall thickness. Right ventricular systolic function is normal. Left Atrium: Left atrial size was mildly dilated. Right Atrium: Right atrial size was normal in size. Pericardium: There is no evidence of pericardial effusion. Mitral Valve: The mitral valve is normal in structure. Trivial mitral valve regurgitation. No evidence of mitral valve stenosis. Tricuspid Valve: The tricuspid valve is normal in structure. Tricuspid valve regurgitation is trivial. No evidence of tricuspid stenosis. Aortic Valve: The aortic valve is normal in structure. Aortic valve regurgitation is not visualized. No aortic stenosis is present. Aortic valve mean gradient measures 2.0 mmHg. Aortic valve peak gradient measures 3.8 mmHg. Aortic valve area, by VTI measures 4.03 cm. Pulmonic Valve: The  pulmonic valve was normal in structure. Pulmonic valve regurgitation is trivial. No evidence of pulmonic stenosis. Aorta: The aortic root is normal in size and structure. Venous: The inferior vena cava is normal in size with greater than 50% respiratory variability, suggesting right atrial pressure of 3 mmHg. IAS/Shunts: No atrial level shunt detected by color flow Doppler.  LEFT VENTRICLE PLAX 2D LVIDd:         4.61 cm  Diastology LVIDs:         3.19 cm  LV e' medial:    4.79 cm/s LV PW:  1.42 cm  LV E/e' medial:  10.2 LV IVS:        1.14 cm  LV e' lateral:   6.20 cm/s LVOT diam:     2.30 cm  LV E/e' lateral: 7.9 LV SV:         91 LV SV Index:   37 LVOT Area:     4.15 cm  RIGHT VENTRICLE RV Basal diam:  2.84 cm RV S prime:     13.60 cm/s TAPSE (M-mode): 3.8 cm LEFT ATRIUM             Index       RIGHT ATRIUM           Index LA diam:        4.10 cm 1.67 cm/m  RA Area:     19.80 cm LA Vol (A2C):   85.4 ml 34.74 ml/m RA Volume:   54.90 ml  22.33 ml/m LA Vol (A4C):   32.0 ml 13.02 ml/m LA Biplane Vol: 52.6 ml 21.40 ml/m  AORTIC VALVE AV Area (Vmax):    4.02 cm AV Area (Vmean):   3.95 cm AV Area (VTI):     4.03 cm AV Vmax:           96.85 cm/s AV Vmean:          70.950 cm/s AV VTI:            0.224 m AV Peak Grad:      3.8 mmHg AV Mean Grad:      2.0 mmHg LVOT Vmax:         93.80 cm/s LVOT Vmean:        67.400 cm/s LVOT VTI:          0.218 m LVOT/AV VTI ratio: 0.97  AORTA Ao Root diam: 3.10 cm MITRAL VALVE               TRICUSPID VALVE MV Area (PHT): 3.34 cm    TR Peak grad:   7.8 mmHg MV Decel Time: 227 msec    TR Vmax:        140.00 cm/s MV E velocity: 49.00 cm/s MV A velocity: 69.40 cm/s  SHUNTS MV E/A ratio:  0.71        Systemic VTI:  0.22 m                            Systemic Diam: 2.30 cm Adrian BlackwaterShaukat Khan MD Electronically signed by Adrian BlackwaterShaukat Khan MD Signature Date/Time: 04/16/2020/2:04:09 PM    Final      Labs:   Basic Metabolic Panel: Recent Labs  Lab 04/16/20 0212 04/17/20 0445  NA 140  138  K 3.1* 3.6  CL 104 104  CO2 24 25  GLUCOSE 219* 146*  BUN 14 13  CREATININE 0.88 0.77  CALCIUM 8.6* 8.6*  MG 1.9 2.2   GFR Estimated Creatinine Clearance: 128.2 mL/min (by C-G formula based on SCr of 0.77 mg/dL). Liver Function Tests: Recent Labs  Lab 04/16/20 0212  AST 22  ALT 18  ALKPHOS 63  BILITOT 1.0  PROT 7.5  ALBUMIN 4.3   No results for input(s): LIPASE, AMYLASE in the last 168 hours. No results for input(s): AMMONIA in the last 168 hours. Coagulation profile No results for input(s): INR, PROTIME in the last 168 hours.  CBC: Recent Labs  Lab 04/16/20 0212 04/17/20 0445  WBC 14.5* 11.4*  NEUTROABS 10.1*  --   HGB  14.8 13.2  HCT 42.6 39.1  MCV 88.8 90.3  PLT 324 258   Cardiac Enzymes: No results for input(s): CKTOTAL, CKMB, CKMBINDEX, TROPONINI in the last 168 hours. BNP: Invalid input(s): POCBNP CBG: Recent Labs  Lab 04/17/20 0738 04/17/20 1156 04/17/20 1658 04/17/20 2051 04/18/20 0750  GLUCAP 150* 159* 100* 153* 167*   D-Dimer No results for input(s): DDIMER in the last 72 hours. Hgb A1c Recent Labs    04/16/20 0737  HGBA1C 6.7*   Lipid Profile Recent Labs    04/17/20 0445  CHOL 131  HDL 28*  LDLCALC 79  TRIG 811  CHOLHDL 4.7   Thyroid function studies Recent Labs    04/16/20 0737  TSH 1.228   Anemia work up No results for input(s): VITAMINB12, FOLATE, FERRITIN, TIBC, IRON, RETICCTPCT in the last 72 hours. Microbiology Recent Results (from the past 240 hour(s))  Respiratory Panel by RT PCR (Flu A&B, Covid) - Nasopharyngeal Swab     Status: None   Collection Time: 04/16/20  2:49 AM   Specimen: Nasopharyngeal Swab  Result Value Ref Range Status   SARS Coronavirus 2 by RT PCR NEGATIVE NEGATIVE Final    Comment: (NOTE) SARS-CoV-2 target nucleic acids are NOT DETECTED.  The SARS-CoV-2 RNA is generally detectable in upper respiratoy specimens during the acute phase of infection. The lowest concentration of SARS-CoV-2  viral copies this assay can detect is 131 copies/mL. A negative result does not preclude SARS-Cov-2 infection and should not be used as the sole basis for treatment or other patient management decisions. A negative result may occur with  improper specimen collection/handling, submission of specimen other than nasopharyngeal swab, presence of viral mutation(s) within the areas targeted by this assay, and inadequate number of viral copies (<131 copies/mL). A negative result must be combined with clinical observations, patient history, and epidemiological information. The expected result is Negative.  Fact Sheet for Patients:  https://www.moore.com/  Fact Sheet for Healthcare Providers:  https://www.young.biz/  This test is no t yet approved or cleared by the [place] FDA and  has been authorized for detection and/or diagnosis of SARS-CoV-2 by FDA under an Emergency Use Authorization (EUA). This EUA will remain  in effect (meaning this test can be used) for the duration of the COVID-19 declaration under Section 564(b)(1) of the Act, 21 U.S.C. section 360bbb-3(b)(1), unless the authorization is terminated or revoked sooner.     Influenza A by PCR NEGATIVE NEGATIVE Final   Influenza B by PCR NEGATIVE NEGATIVE Final    Comment: (NOTE) The Xpert Xpress SARS-CoV-2/FLU/RSV assay is intended as an aid in  the diagnosis of influenza from Nasopharyngeal swab specimens and  should not be used as a sole basis for treatment. Nasal washings and  aspirates are unacceptable for Xpert Xpress SARS-CoV-2/FLU/RSV  testing.  Fact Sheet for Patients: https://www.moore.com/  Fact Sheet for Healthcare Providers: https://www.young.biz/  This test is not yet approved or cleared by the [place] FDA and  has been authorized for detection and/or diagnosis of SARS-CoV-2 by  FDA under an Emergency Use Authorization (EUA).  This EUA will remain  in effect (meaning this test can be used) for the duration of the  Covid-19 declaration under Section 564(b)(1) of the Act, 21  U.S.C. section 360bbb-3(b)(1), unless the authorization is  terminated or revoked. Performed at [facility], [address] [place], [place] [address]      Discharge Instructions:   Discharge Instructions    Diet - low sodium heart healthy  Complete by: As directed    Increase activity slowly   Complete by: As directed      Allergies as of 04/18/2020      Reactions   Iodine Anaphylaxis, Hives, Rash   Morphine And Related Anaphylaxis   Nsaids Other (See Comments)   Kidney problems   Sulfa Antibiotics Rash      Medication List    STOP taking these medications   propranolol 40 MG tablet Commonly known as: INDERAL   simvastatin 20 MG tablet Commonly known as: ZOCOR     TAKE these medications   albuterol 108 (90 Base) MCG/ACT inhaler Commonly known as: VENTOLIN HFA Inhale 2 puffs into the lungs every 6 (six) hours as needed for wheezing or shortness of breath.   atorvastatin 40 MG tablet Commonly known as: LIPITOR Take 40 mg by mouth at bedtime.   baclofen 10 MG tablet Commonly known as: LIORESAL Take 10 mg by mouth 2 (two) times daily as needed.   [employer] Vitamin D3 25 MCG (1000 UT) capsule Generic drug: Cholecalciferol Take 1,000 Units by mouth daily.   DULoxetine 30 MG capsule Commonly known as: CYMBALTA Take 30 mg by mouth 2 (two) times daily.   gabapentin 300 MG capsule Commonly known as: NEURONTIN Take 900 mg by mouth 2 (two) times daily.   insulin aspart 100 UNIT/ML injection Commonly known as: novoLOG Inject 10-15 Units into the skin 2 (two) times daily as needed for high blood sugar. Take 15 units in the morning and 10 units at night.   lisinopril 10 MG tablet Commonly known as: ZESTRIL Take 10 mg by mouth daily.   metFORMIN 500 MG tablet Commonly known as: GLUCOPHAGE Take 500 mg by  mouth 2 (two) times daily.   nortriptyline 75 MG capsule Commonly known as: PAMELOR Take 75 mg by mouth at bedtime.   omeprazole 20 MG capsule Commonly known as: PRILOSEC Take 40 mg by mouth daily.   orphenadrine 100 MG tablet Commonly known as: NORFLEX Take 1 tablet (100 mg total) by mouth 2 (two) times daily.   OxyCONTIN 20 mg 12 hr tablet Generic drug: oxyCODONE Take 40 mg by mouth every 12 (twelve) hours.   primidone 50 MG tablet Commonly known as: MYSOLINE Take 50 mg by mouth daily.   sotalol 80 MG tablet Commonly known as: BETAPACE Take 1 tablet (80 mg total) by mouth daily. Start taking on: April 19, 2020   traZODone 150 MG tablet Commonly known as: DESYREL Take 300 mg by mouth at bedtime.   zolpidem 10 MG tablet Commonly known as: AMBIEN Take 10 mg by mouth at bedtime as needed.       Follow-up Information    Laurier Nancy, MD. Go in 1 week(s).   Specialty: Cardiology Why: 04/25/2020 930AM Contact information: [address] [place] [phone]                Time coordinating discharge: 28 minutes  Signed:  Lurene Shadow  [facility] 04/18/2020, 11:49 AM   Pager on [email or web address]. If 7PM-7AM, please contact night-coverage at [email or web address]

## 2020-04-18 NOTE — Progress Notes (Signed)
SUBJECTIVE: Patient resting comfortably. No active complaints. Denies chest pain or shortness of breath. No acute events overnight.   Vitals:   04/17/20 1950 04/18/20 0415 04/18/20 0416 04/18/20 0750  BP: (!) 146/72  (!) 142/67 133/75  Pulse: 73  68 84  Resp: 20  20 17   Temp: 99 F (37.2 C)  98 F (36.7 C) 97.9 F (36.6 C)  TempSrc: Oral   Oral  SpO2: 95%  96% 98%  Weight:  123.4 kg    Height:        Intake/Output Summary (Last 24 hours) at 04/18/2020 1022 Last data filed at 04/17/2020 1349 Gross per 24 hour  Intake --  Output 450 ml  Net -450 ml    LABS: Basic Metabolic Panel: Recent Labs    04/16/20 0212 04/17/20 0445  NA 140 138  K 3.1* 3.6  CL 104 104  CO2 24 25  GLUCOSE 219* 146*  BUN 14 13  CREATININE 0.88 0.77  CALCIUM 8.6* 8.6*  MG 1.9 2.2   Liver Function Tests: Recent Labs    04/16/20 0212  AST 22  ALT 18  ALKPHOS 63  BILITOT 1.0  PROT 7.5  ALBUMIN 4.3   No results for input(s): LIPASE, AMYLASE in the last 72 hours. CBC: Recent Labs    04/16/20 0212 04/17/20 0445  WBC 14.5* 11.4*  NEUTROABS 10.1*  --   HGB 14.8 13.2  HCT 42.6 39.1  MCV 88.8 90.3  PLT 324 258   Cardiac Enzymes: No results for input(s): CKTOTAL, CKMB, CKMBINDEX, TROPONINI in the last 72 hours. BNP: Invalid input(s): POCBNP D-Dimer: No results for input(s): DDIMER in the last 72 hours. Hemoglobin A1C: Recent Labs    04/16/20 0737  HGBA1C 6.7*   Fasting Lipid Panel: Recent Labs    04/17/20 0445  CHOL 131  HDL 28*  LDLCALC 79  TRIG 04/19/20  CHOLHDL 4.7   Thyroid Function Tests: Recent Labs    04/16/20 0737  TSH 1.228   Anemia Panel: No results for input(s): VITAMINB12, FOLATE, FERRITIN, TIBC, IRON, RETICCTPCT in the last 72 hours.   PHYSICAL EXAM General: Well developed, well nourished, in no acute distress HEENT:  Normocephalic and atramatic Neck:  No JVD.  Lungs: Clear bilaterally to auscultation and percussion. Heart: HRRR . Normal S1 and S2  without gallops or murmurs.  Abdomen: Bowel sounds are positive, abdomen soft and non-tender  Msk:  Back normal, normal gait. Normal strength and tone for age. Extremities: No clubbing, cyanosis or edema.   Neuro: Alert and oriented X 3. Psych:  Good affect, responds appropriately  TELEMETRY: NSR 82/bpm  ASSESSMENT AND PLAN: Patient presenting to the emergency department due to SVT which resolved with adenosine.  Patient remains in normal sinus rhythm.  Echocardiogram revealed ejection fraction 55-60% with grade 1 diastolic dysfunction and no other significant structural abnormalities.  Patient was started on sotalol 80 mg daily yesterday. QTc stable on EKG this AM. From a cardiac point of view patient is stable for discharge. We will see the patient at our office next week.   Principal Problem:   SVT (supraventricular tachycardia) (HCC) Active Problems:   Elevated troponin   Diabetes mellitus without complication (HCC)   Hypertension   HLD (hyperlipidemia)   GERD (gastroesophageal reflux disease)   Depression   Chronic pain   Tobacco abuse   Hypokalemia    11-27-1994, NP-C 04/18/2020 10:22 AM

## 2021-12-01 ENCOUNTER — Emergency Department
Admission: EM | Admit: 2021-12-01 | Discharge: 2021-12-01 | Disposition: A | Discharge status: Home / Self Care | Payer: Medicare Other | Attending: Emergency Medicine | Admitting: Emergency Medicine

## 2021-12-01 ENCOUNTER — Other Ambulatory Visit: Payer: Self-pay

## 2021-12-01 ENCOUNTER — Emergency Department: Payer: Medicare Other

## 2021-12-01 ENCOUNTER — Encounter: Payer: Self-pay | Admitting: Emergency Medicine

## 2021-12-01 DIAGNOSIS — F17200 Nicotine dependence, unspecified, uncomplicated: Secondary | ICD-10-CM | POA: Diagnosis not present

## 2021-12-01 DIAGNOSIS — M542 Cervicalgia: Secondary | ICD-10-CM

## 2021-12-01 DIAGNOSIS — I10 Essential (primary) hypertension: Secondary | ICD-10-CM | POA: Insufficient documentation

## 2021-12-01 DIAGNOSIS — W010XXA Fall on same level from slipping, tripping and stumbling without subsequent striking against object, initial encounter: Secondary | ICD-10-CM | POA: Diagnosis not present

## 2021-12-01 DIAGNOSIS — R000 Tachycardia, unspecified: Secondary | ICD-10-CM | POA: Diagnosis not present

## 2021-12-01 DIAGNOSIS — E119 Type 2 diabetes mellitus without complications: Secondary | ICD-10-CM | POA: Insufficient documentation

## 2021-12-01 DIAGNOSIS — R519 Headache, unspecified: Secondary | ICD-10-CM | POA: Diagnosis not present

## 2021-12-01 DIAGNOSIS — M5412 Radiculopathy, cervical region: Secondary | ICD-10-CM

## 2021-12-01 DIAGNOSIS — M4722 Other spondylosis with radiculopathy, cervical region: Secondary | ICD-10-CM | POA: Diagnosis not present

## 2021-12-01 LAB — BASIC METABOLIC PANEL
Anion gap: 9 (ref 5–15)
BUN: 16 mg/dL (ref 8–23)
CO2: 25 mmol/L (ref 22–32)
Calcium: 9 mg/dL (ref 8.9–10.3)
Chloride: 106 mmol/L (ref 98–111)
Creatinine, Ser: 1.02 mg/dL (ref 0.61–1.24)
GFR, Estimated: >60 mL/min (ref >60)
Glucose, Bld: 246 mg/dL — ABNORMAL HIGH (ref 70–99)
Potassium: 3.9 mmol/L (ref 3.5–5.1)
Sodium: 140 mmol/L (ref 135–145)

## 2021-12-01 LAB — CBC
HCT: 42.2 % (ref 39.0–52.0)
Hemoglobin: 14 g/dL (ref 13.0–17.0)
MCH: 29.8 pg (ref 26.0–34.0)
MCHC: 33.2 g/dL (ref 30.0–36.0)
MCV: 89.8 fL (ref 80.0–100.0)
Platelets: 264 10*3/uL (ref 150–400)
RBC: 4.7 MIL/uL (ref 4.22–5.81)
RDW: 12.4 % (ref 11.5–15.5)
WBC: 10 10*3/uL (ref 4.0–10.5)
nRBC: 0 % (ref 0.0–0.2)

## 2021-12-01 MED ORDER — LIDOCAINE 5 % EX PTCH: 1.0000 | MEDICATED_PATCH | CUTANEOUS | 0 refills | Status: AC

## 2021-12-01 MED ORDER — KETOROLAC TROMETHAMINE 15 MG/ML IJ SOLN
15.0000 mg | Freq: Once | INTRAMUSCULAR | Status: AC
Start: 2021-12-01 — End: 2021-12-01
  Administered 2021-12-01: 15 mg via INTRAMUSCULAR
  Filled 2021-12-01: qty 1

## 2021-12-01 MED ORDER — OXYCODONE HCL 5 MG PO TABS
20.0000 mg | ORAL_TABLET | Freq: Once | ORAL | Status: AC
  Administered 2021-12-01: 20 mg via ORAL
  Filled 2021-12-01: qty 4

## 2021-12-01 MED ORDER — CYCLOBENZAPRINE HCL 10 MG PO TABS
5.0000 mg | ORAL_TABLET | Freq: Once | ORAL | Status: AC
  Administered 2021-12-01: 5 mg via ORAL
  Filled 2021-12-01: qty 1

## 2021-12-01 MED ORDER — LIDOCAINE 5 % EX PTCH
1.0000 | MEDICATED_PATCH | CUTANEOUS | Status: DC
  Administered 2021-12-01: 1 via TRANSDERMAL
  Filled 2021-12-01: qty 1

## 2021-12-01 MED ORDER — ACETAMINOPHEN 500 MG PO TABS
1000.0000 mg | ORAL_TABLET | Freq: Once | ORAL | Status: AC
Start: 2021-12-01 — End: 2021-12-01
  Administered 2021-12-01: 1000 mg via ORAL
  Filled 2021-12-01: qty 2

## 2021-12-01 NOTE — ED Triage Notes (Signed)
Patient reports he lost his balance and fell.  Patient reports having neck pain, left shoulder down left arm pain.

## 2021-12-01 NOTE — ED Provider Notes (Signed)
[facility] Provider Note    Event Date/Time   First MD Initiated Contact with Patient 12/01/21 [phone]     (approximate)   History   Fall   HPI  Shane Mercado is a 66 y.o. male past medical history of hypertension diabetes and chronic pain who presents after a fall.  Patient was bending down yesterday when he lost his balance falling backwards.  Did not hit his head.  Since that time he is been having radiating pain down the left arm and around the left scapula.  Denies actual neck pain.  Pain is worse with neck movement.  Denies numbness tingling or weakness.  No bowel bladder incontinence or difficulty walking.  Denies chest pain.  Patient takes OxyContin 40 mg twice daily as well as gabapentin and duloxetine for chronic pain.  He took a total of 120 mg of OxyContin yesterday due to the pain which did not alleviate.    Past Medical History:  Diagnosis Date   Back injury    Chronic pain    Diabetes mellitus without complication (HCC)    Hypertension    Renal disorder    kidney failure    Patient Active Problem List   Diagnosis Date Noted   SVT (supraventricular tachycardia) (HCC) 04/16/2020   Elevated troponin 04/16/2020   Diabetes mellitus without complication (HCC)    Hypertension    HLD (hyperlipidemia)    GERD (gastroesophageal reflux disease)    Depression    Chronic pain    Tobacco abuse    Hypokalemia      Physical Exam  Triage Vital Signs: ED Triage Vitals  Enc Vitals Group     BP 12/01/21 0648 (!) 230/114     Pulse Rate 12/01/21 0648 (!) 109     Resp 12/01/21 0648 18     Temp 12/01/21 0648 98.7 F (37.1 C)     Temp Source 12/01/21 0648 Oral     SpO2 12/01/21 0648 94 %     Weight 12/01/21 0649 286 lb (129.7 kg)     Height 12/01/21 0649 6' (1.829 m)     Head Circumference --      Peak Flow --      Pain Score 12/01/21 0649 9     Pain Loc --      Pain Edu? --      Excl. in GC? --     Most recent vital signs: Vitals:    12/01/21 0648 12/01/21 0854  BP: (!) 230/114 (!) 194/98  Pulse: (!) 109 (!) 103  Resp: 18 18  Temp: 98.7 F (37.1 C)   SpO2: 94% 94%     General: Awake, no distress.  CV:  Good peripheral perfusion.  Resp:  Normal effort.  Abd:  No distention.  Neuro:             Awake, Alert, Oriented x 3  Other:  Mild left-sided paraspinal tenderness no midline C-spine tenderness 5 out of 5 strength with elbow flexion extension wrist extension and grip and finger abduction Sensation grossly intact in bilateral upper extremities Able to flex to 45 degrees right and left   ED Results / Procedures / Treatments  Labs (all labs ordered are listed, but only abnormal results are displayed) Labs Reviewed  BASIC METABOLIC PANEL - Abnormal; Notable for the following components:      Result Value   Glucose, Bld 246 (*)    All other components within normal limits  CBC  URINALYSIS, ROUTINE W REFLEX MICROSCOPIC     EKG  EKG reviewed and interpreted by myself shows sinus tachycardia with left axis deviation 2 inversions in V2 V3 unchanged from prior   RADIOLOGY I reviewed and interpreted the CT scan of the brain which does not show any acute intracranial process    PROCEDURES:  Critical Care performed: No  Procedures    MEDICATIONS ORDERED IN ED: Medications  lidocaine (LIDODERM) 5 % 1 patch (1 patch Transdermal Patch Applied 12/01/21 0856)  ketorolac (TORADOL) 15 MG/ML injection 15 mg (15 mg Intramuscular Given 12/01/21 0800)  oxyCODONE (Oxy IR/ROXICODONE) immediate release tablet 20 mg (20 mg Oral Given 12/01/21 0803)  acetaminophen (TYLENOL) tablet 1,000 mg (1,000 mg Oral Given 12/01/21 0802)  cyclobenzaprine (FLEXERIL) tablet 5 mg (5 mg Oral Given 12/01/21 0856)     IMPRESSION / MDM / ASSESSMENT AND PLAN / ED COURSE  I reviewed the triage vital signs and the nursing notes.                              Patient's presentation is most consistent with acute presentation with  potential threat to life or bodily function.  Differential diagnosis includes, but is not limited to, cervical radiculopathy, cervical sprain, less likely cervical myelopathy cord compression cervical fracture  Is a 66 year old male with chronic pain who presents with left arm pain and neck pain after a fall.  Yesterday he had crouched down and lost his balance falling backwards without hitting his head.  Since that time has had radicular pain down the left arm without numbness tingling weakness.  He has no history of prior neck pain or similar he tells me.  Takes OxyContin as well as multiple other medications for chronic pain daily.  Took a total of 120 mg of OxyContin yesterday with minimal relief.  The pain on the arm is worse with movement and neck movement.  On exam overall he appears well some mild discomfort with movement.  He has good strength in the C5-T1 distributions and sensation is intact.  CT head and C-spine were obtained from triage CT head is negative for acute abnormality as a CT C-spine but there are multilevel degenerative disc changes and spondylosis in the C-spine resulting in central spinal canal stenosis most severe at C6-7.  Ultimately I suspect cervical radiculopathy exacerbated by cervical sprain caused by the fall yesterday.  Mechanism is the cause cervical spine fracture or ligamentous injury.  I have low suspicion for cord compression based on his exam today.  We will treat pain with opiates NSAIDs and Tylenol.  After 20 mg of oxycodone Toradol and Tylenol patient says pain is improved however still having significant radicular pain.  No weakness able to range his neck.  Again my suspicion for cord compression is low.  Patient has a spinal cord stimulator in place is not able to get an MRI so this would not be able to be done in the ER anyway do not feel it is indicated.  Feel that risks of steroids in this patient with diabetes who is already hyperglycemic here outweigh  benefits.  Discussed follow-up with his neurosurgeon as well as primary care who manages his pain medication.      FINAL CLINICAL IMPRESSION(S) / ED DIAGNOSES   Final diagnoses:  Neck pain  Cervical radiculopathy     Rx / DC Orders   ED Discharge Orders  Ordered    lidocaine (LIDODERM) 5 %  Every 24 hours        12/01/21 0906             Note:  This document was prepared using Dragon voice recognition software and may include unintentional dictation errors.   Georga HackingMcHugh, Gail Vendetti Rose, MD 12/01/21 [id number]

## 2021-12-01 NOTE — Discharge Instructions (Addendum)
Continue to take the OxyContin and all the other pain medications you are prescribed.  You can take 400 mg of ibuprofen every 6 hours.  I have also prescribed you Lidoderm patch.

## 2021-12-02 ENCOUNTER — Emergency Department
Admission: EM | Admit: 2021-12-02 | Discharge: 2021-12-02 | Discharge status: Against Medical Advice | Payer: Medicare Other | Attending: Emergency Medicine | Admitting: Emergency Medicine

## 2021-12-02 ENCOUNTER — Other Ambulatory Visit: Payer: Self-pay

## 2021-12-02 DIAGNOSIS — Z5321 Procedure and treatment not carried out due to patient leaving prior to being seen by health care provider: Secondary | ICD-10-CM | POA: Insufficient documentation

## 2021-12-02 DIAGNOSIS — M545 Low back pain, unspecified: Secondary | ICD-10-CM | POA: Insufficient documentation

## 2021-12-02 NOTE — ED Triage Notes (Signed)
Pt comes with c/o back pain. Pt was seen here yesterday and evaluated. Pt was prescribed pain medications. Pt also states now he feels funny. EMS reports VSS  Pt was dx with pinched nerve.

## 2021-12-02 NOTE — ED Notes (Signed)
Pt seen being wheeled out by family. Pt and family stopped by this RN. This RN updated family and once again attempted to get pt back to room. Pt refused and stated he we let him sit out here for 2 hours so why care now. Pt informed we were sorry . Pt still wishing to leave. Family apologized and pt was wheeled out via wheelchair.

## 2021-12-02 NOTE — ED Notes (Signed)
PA Jenise informed of situation with pt. PA Jenise in lobby attempting to assess pt. Pt refusing to let her listen to him and get him into room. Pt advised to let us know if he needs anything.

## 2021-12-02 NOTE — ED Provider Triage Note (Signed)
Emergency Medicine Provider Triage Evaluation Note  BENSEN SOLDANO, a 66 y.o. male  was evaluated in triage.  Pt complains of back pain.  Patient was evaluated in the ED yesterday and treated for the same.  He reports now he "feels funny."  Patient presents via EMS for evaluation of symptoms but he denies any recent injury, trauma, fall.  Review of Systems  Positive: Back pain Negative: Bladder or bowel incontinence  Physical Exam  BP (!) 159/77   Pulse 90   Temp 98 F (36.7 C) (Oral)   Resp 20   SpO2 90%  Gen:   Awake, no distress  Diaphoretic Resp:  Normal effort  MSK:   Moves extremities without difficulty  Other:    Medical Decision Making  Medically screening exam initiated at 6:40 PM.  Appropriate orders placed.  RASAAN PHOMMACHANH was informed that the remainder of the evaluation will be completed by another provider, this initial triage assessment does not replace that evaluation, and the importance of remaining in the ED until their evaluation is complete.  Patient to return to the ED for evaluation of persistent back pain.  Patient refused further evaluation even after being notified that his room was ready and available in the treatment area.   Melvenia Needles, PA-C 12/02/21 1849

## 2021-12-02 NOTE — ED Notes (Signed)
Pt walking to restroom at this time.

## 2021-12-02 NOTE — ED Notes (Signed)
EDT Shawn attempted to collect vitals. Pt refused. Pt stated do not touch me to EDT. EDT informed this RN.  This RN went over to pt and stated that we needed to collect his vitals. Pt states fine do whatever. Vitals collected by EDT Shawn. Pt updated on wait time.

## 2021-12-02 NOTE — ED Notes (Addendum)
Pt called for room and not answering EDT Brayton Caves. EDT Brayton Caves showed where pt was waiting. Per EDT B[place]ton Caves pt now refusing to go back to room.  This RN went over to pt and stated that we had room for him. Pt refusing room. Pt is diaphoretic. Pt states it is bc he is in pain. This RN states to pt that I can take you back to doc right now. Pt refusing. This RN states you need to go back and see a doc. Pt refused and says no Im leaving my ride is on the way.RN asked pt if I could at least recheck his vitals and he stated NO.

## 2023-04-01 ENCOUNTER — Emergency Department
Admission: EM | Admit: 2023-04-01 | Discharge: 2023-04-01 | Discharge status: Against Medical Advice | Payer: Medicare Other | Attending: Emergency Medicine | Admitting: Emergency Medicine

## 2023-04-01 ENCOUNTER — Other Ambulatory Visit: Payer: Self-pay

## 2023-04-01 DIAGNOSIS — Z5321 Procedure and treatment not carried out due to patient leaving prior to being seen by health care provider: Secondary | ICD-10-CM | POA: Insufficient documentation

## 2023-04-01 DIAGNOSIS — Y92481 Parking lot as the place of occurrence of the external cause: Secondary | ICD-10-CM | POA: Insufficient documentation

## 2023-04-01 DIAGNOSIS — R42 Dizziness and giddiness: Secondary | ICD-10-CM | POA: Diagnosis not present

## 2023-04-01 DIAGNOSIS — R062 Wheezing: Secondary | ICD-10-CM | POA: Diagnosis not present

## 2023-04-01 DIAGNOSIS — R000 Tachycardia, unspecified: Secondary | ICD-10-CM | POA: Insufficient documentation

## 2023-04-01 DIAGNOSIS — W19XXXA Unspecified fall, initial encounter: Secondary | ICD-10-CM | POA: Insufficient documentation

## 2023-04-01 LAB — CBC
HCT: 42.1 % (ref 39.0–52.0)
Hemoglobin: 14.4 g/dL (ref 13.0–17.0)
MCH: 29.6 pg (ref 26.0–34.0)
MCHC: 34.2 g/dL (ref 30.0–36.0)
MCV: 86.6 fL (ref 80.0–100.0)
Platelets: 222 10*3/uL (ref 150–400)
RBC: 4.86 MIL/uL (ref 4.22–5.81)
RDW: 13.3 % (ref 11.5–15.5)
WBC: 7.7 10*3/uL (ref 4.0–10.5)
nRBC: 0 % (ref 0.0–0.2)

## 2023-04-01 LAB — BASIC METABOLIC PANEL
Anion gap: 14 (ref 5–15)
BUN: 18 mg/dL (ref 8–23)
CO2: 21 mmol/L — ABNORMAL LOW (ref 22–32)
Calcium: 8.4 mg/dL — ABNORMAL LOW (ref 8.9–10.3)
Chloride: 96 mmol/L — ABNORMAL LOW (ref 98–111)
Creatinine, Ser: 1.43 mg/dL — ABNORMAL HIGH (ref 0.61–1.24)
GFR, Estimated: 54 mL/min — ABNORMAL LOW (ref >60)
Glucose, Bld: 311 mg/dL — ABNORMAL HIGH (ref 70–99)
Potassium: 3.8 mmol/L (ref 3.5–5.1)
Sodium: 131 mmol/L — ABNORMAL LOW (ref 135–145)

## 2023-04-01 NOTE — ED Triage Notes (Signed)
Pt to ED for fall in the parking lot, states got dizzy and lost balance. Reports hitting nose, denies LOC. Pt has been walking in and out of ER visiting family member.  Wheezing noted, pt reports this is normal

## 2023-04-01 NOTE — ED Notes (Addendum)
Patient came to desk requesting to leave. RN had MD come out to lobby and review pt labs/results with him in effort to encourage pt to stay for evaluation and treatment. Risks of leaving reviewed with pt by RN and MD and pt verbalized understanding. Pt still electing to leave. Pt signed AMA form, witnessed by this RN. Pt assisted to vehicle and encouraged to return should any further concerns arise.

## 2023-04-01 NOTE — ED Provider Notes (Signed)
MSE was initiated and I personally evaluated the patient and placed orders (if any) at  7:43 PM on April 01, 2023.  The patient initially registered in the ED due to a fall in the parking lot after visiting his wife here in the hospitali. In Triage, he was found to have a mild temp. Elevation and tachycardia.   Pt now reports that he does not wish to be seen and would like to leave the ED immediately from the [facility]. He is oriented, calm, lucid. He has medical decision making capacity. I discussed the abnormal VS with him, and he reports that his HR is normally elevated. He understands he is welcome and encouraged to return to this ED or seek care anywhere else he likes if he changes his mind or has new/worsening symptoms.   Sharman Cheek, MD 04/01/23 2009

## 2023-12-28 IMAGING — CT CT CERVICAL SPINE W/O CM
3 of 4 series · 12 of 35 positions shown, 14 images · non-contrast
Comparison: None Available.

CLINICAL DATA: 65-year-old male with history of trauma from a fall.
Head and neck pain.



[Series 6: sagittal bone · sagittal · 0.22mm/px · 5 of 81 slices shown, 6 images]
[im 27/81  bone]
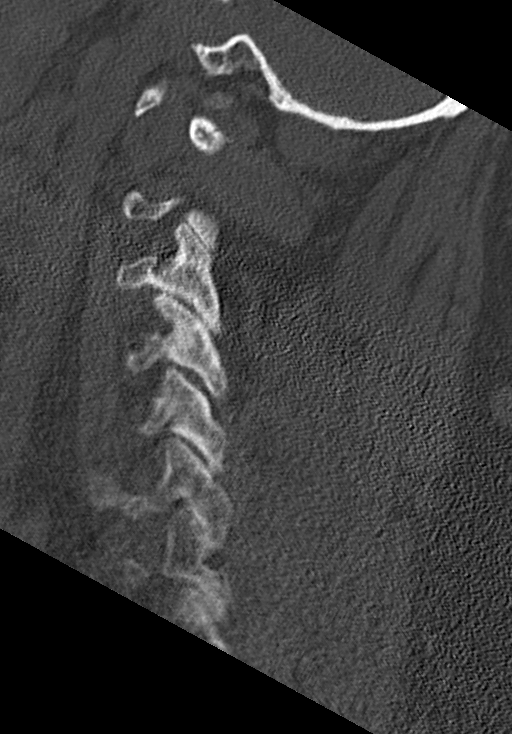
[im 34/81  bone]
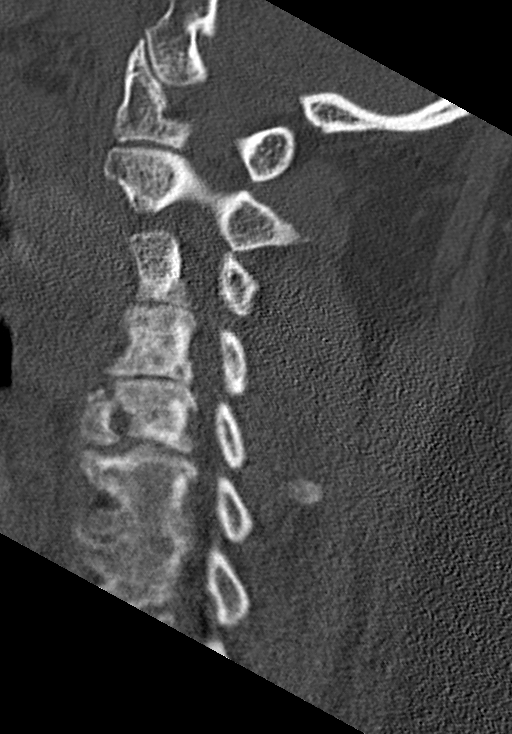
[im 41/81  soft-tissue]
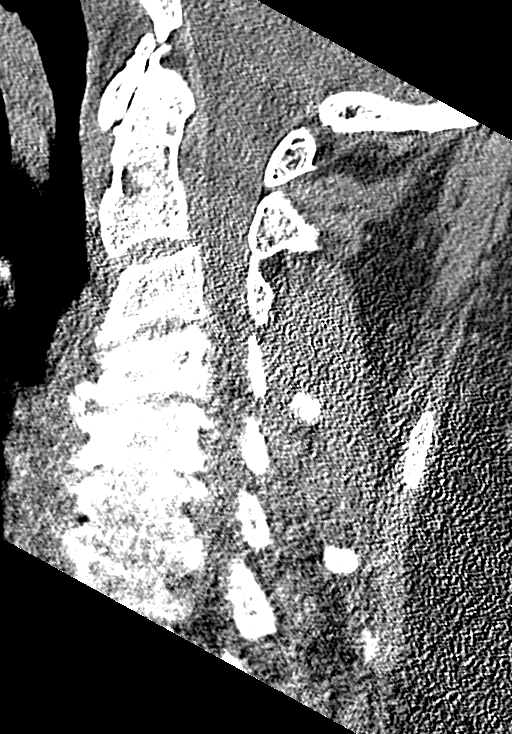
[im 41/81  bone]
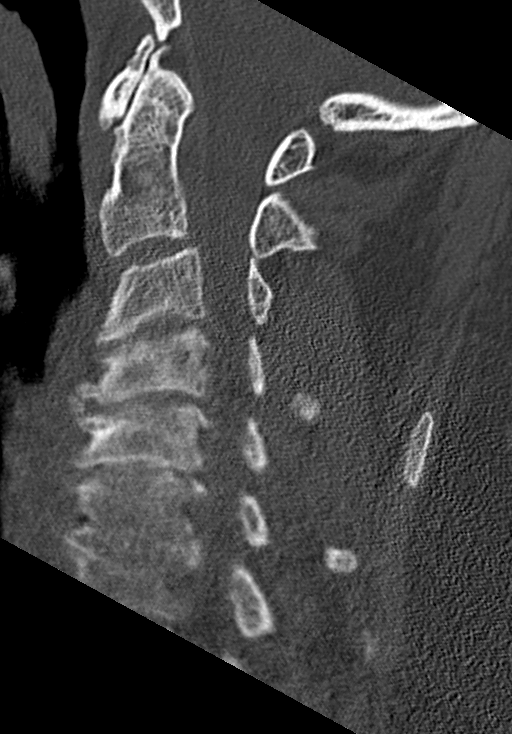
[im 47/81  bone]
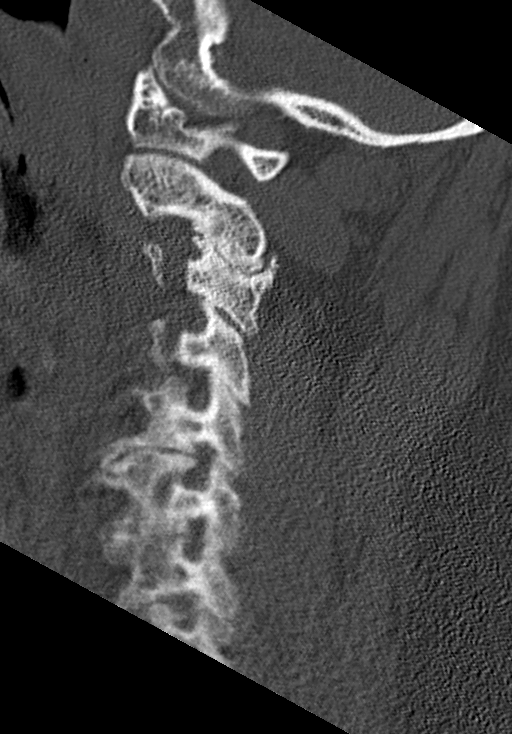
[im 54/81  bone]
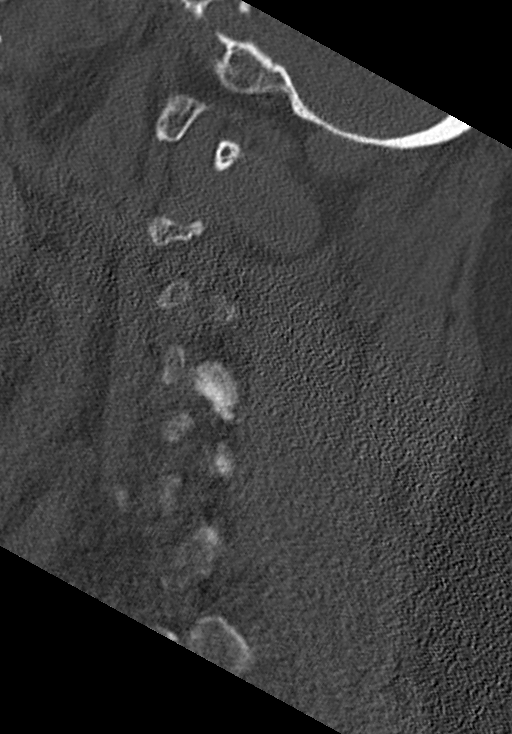

[Series 7: coronal bone · coronal · 0.31mm/px · 3 of 57 slices shown]
[im 14/57  bone]
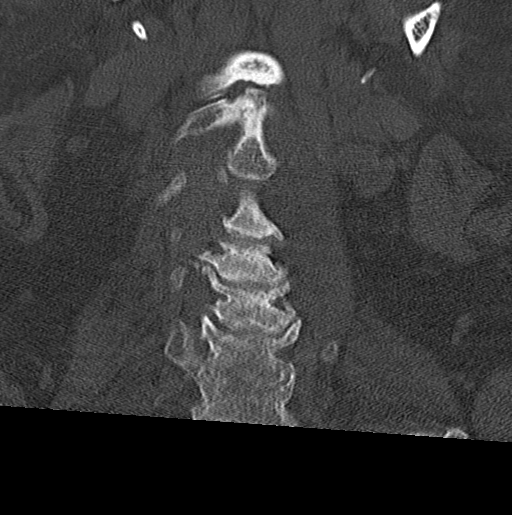
[im 24/57  bone]
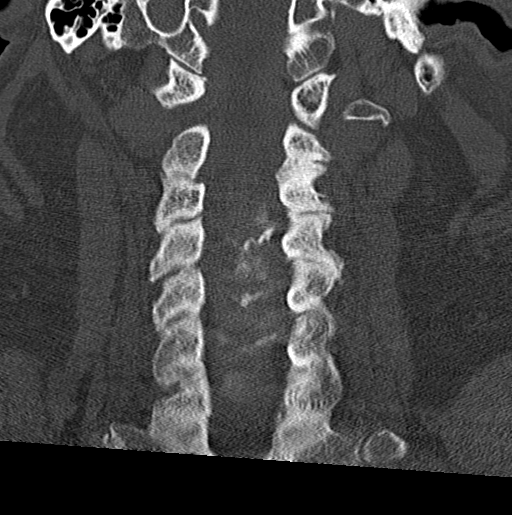
[im 34/57  bone]
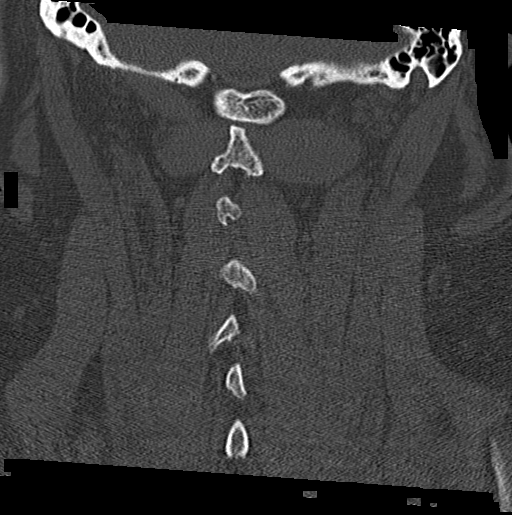

[Series 8: orthogonal bone · axial · 0.22mm/px · z∈[-279,-178]mm · 4 of 82 slices shown, 5 images]
[im 12/82  soft-tissue]
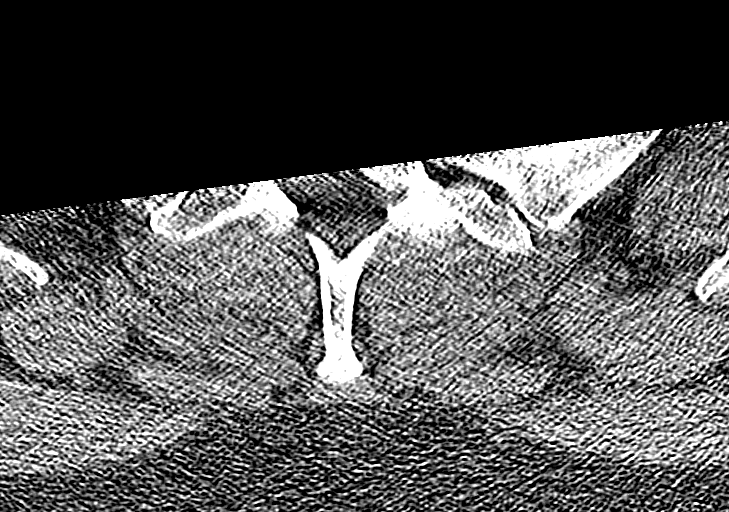
[im 12/82  bone]
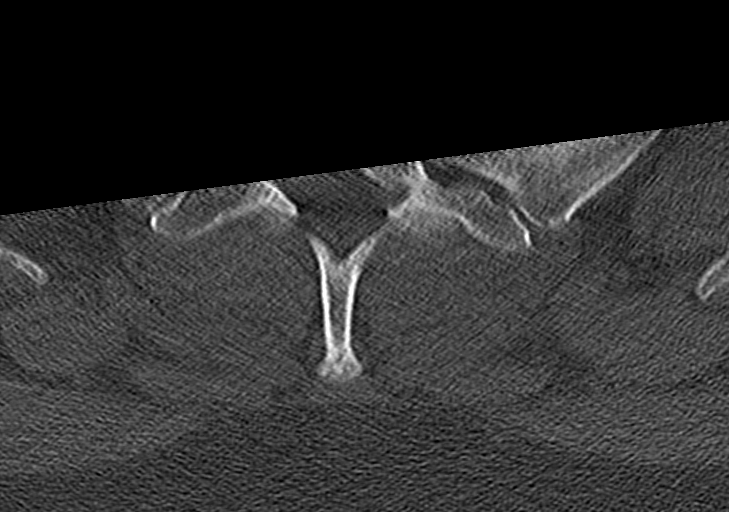
[im 35/82  bone]
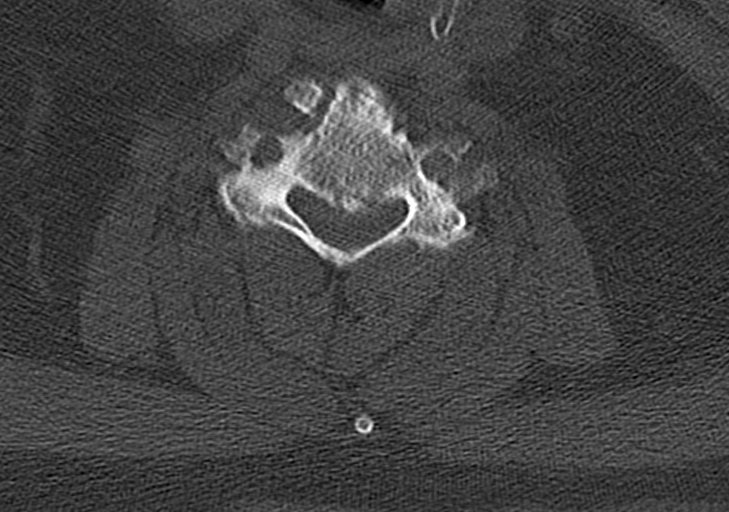
[im 47/82  bone]
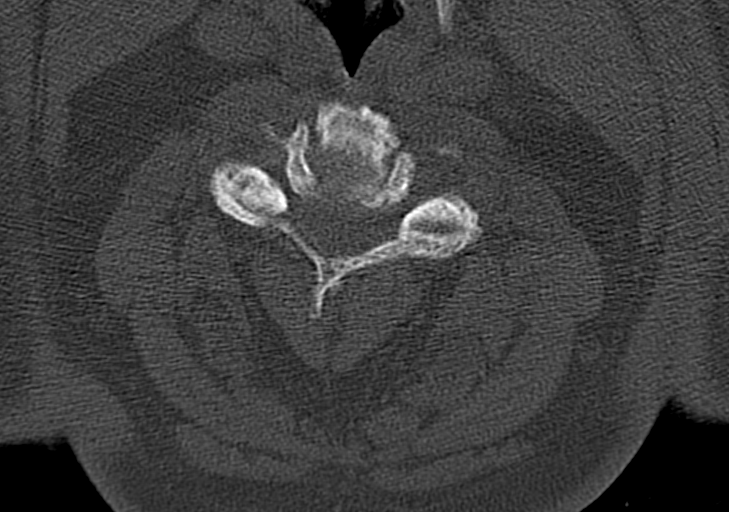
[im 70/82  bone]
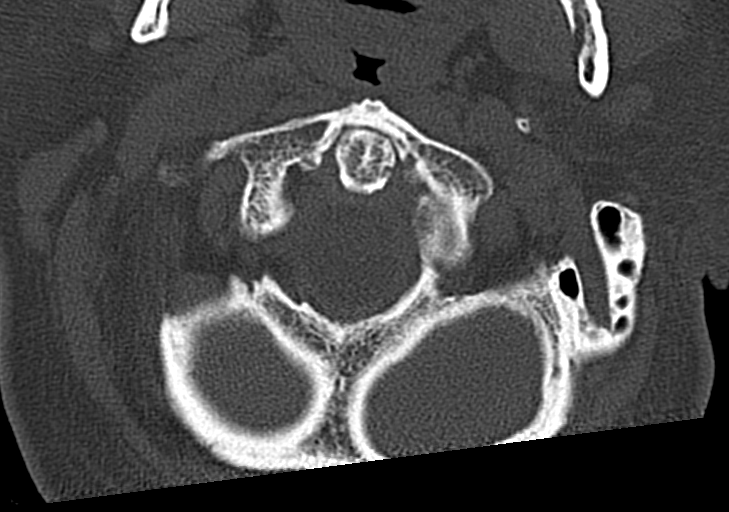

[12 of 35 positions shown; findings below may reference images not displayed]

FINDINGS: CT HEAD FINDINGS

Brain: Patchy areas of decreased attenuation are noted throughout
the deep and periventricular white matter of the cerebral
hemispheres bilaterally, compatible with mild chronic microvascular
ischemic disease. No evidence of acute infarction, hemorrhage,
hydrocephalus, extra-axial collection or mass lesion/mass effect.

Vascular: No hyperdense vessel or unexpected calcification.

Skull: Normal. Negative for fracture or focal lesion.

Sinuses/Orbits: No acute finding.

Other: None.

CT CERVICAL SPINE FINDINGS

Alignment: Reversal of normal cervical lordosis centered at the
level of C4, likely chronic and degenerative in nature. Alignment is
otherwise anatomic.

Skull base and vertebrae: No acute fracture. No primary bone lesion
or focal pathologic process.

Soft tissues and spinal canal: No prevertebral fluid or swelling. No
visible canal hematoma.

Disc levels: Multilevel degenerative disc disease, most pronounced
at C4-C5, C5-C6 and C6-C7. This results in severe narrowing of the
central spinal canal, most pronounced at C6-C7 where the AP diameter
of the canal is 5.5 mm. Multilevel facet arthropathy.

Upper chest: Unremarkable.

Other: None.
IMPRESSION: 1. No evidence of significant acute traumatic injury to the skull,
brain or cervical spine.
2. Mild chronic microvascular ischemic changes in the cerebral white
matter, as above.
3. Extensive multilevel degenerative disc disease and cervical
spondylosis, resulting in central spinal canal stenosis most severe
at C6-C7, as detailed above.

## 2023-12-28 IMAGING — CT CT HEAD W/O CM
4 series · 15 of 47 positions shown, 17 images · non-contrast
Comparison: None Available.

CLINICAL DATA: 65-year-old male with history of trauma from a fall.
Head and neck pain.



[Series 2: head bone · axial · 0.42mm/px · z∈[-111,-97]mm · 2 of 74 slices shown]
[im 8/74  bone]
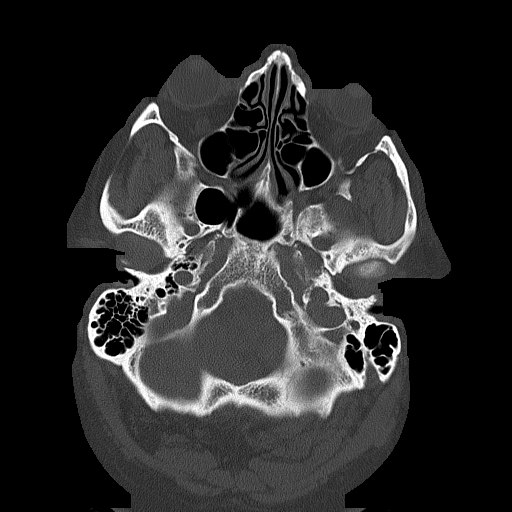
[im 15/74  bone]
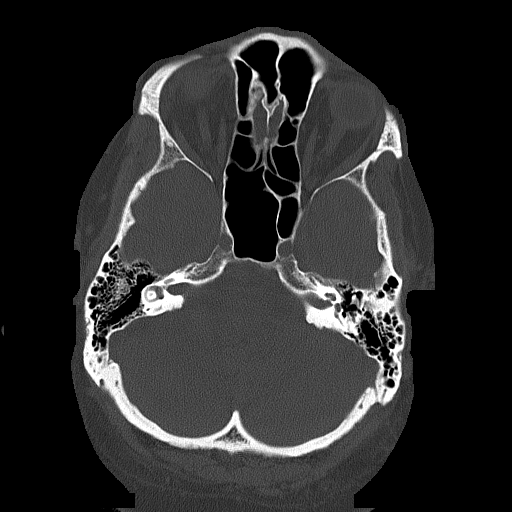

[Series 3: head wo · axial · 0.42mm/px · z∈[-110,-0]mm · 7 of 30 slices shown, 9 images]
[im 4/30  brain]
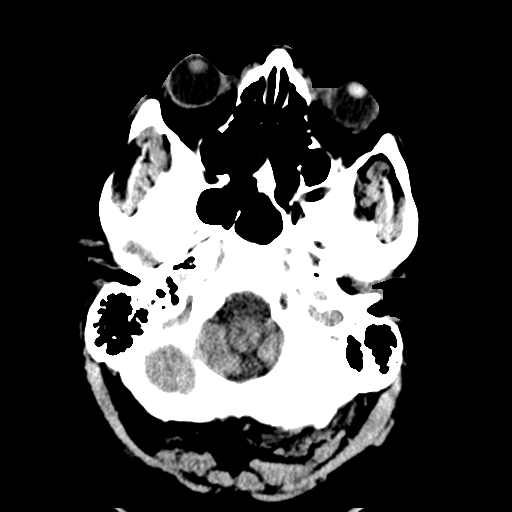
[im 4/30  bone]
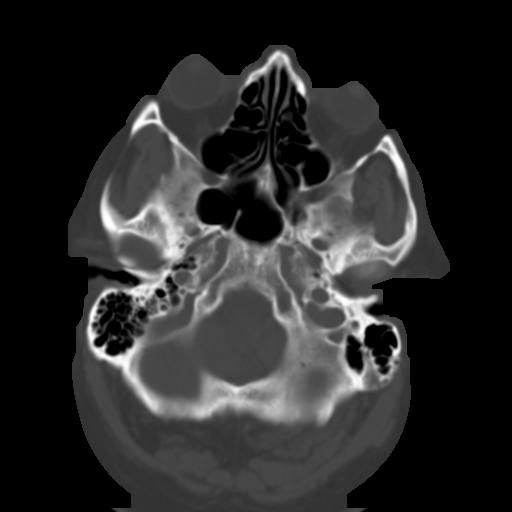
[im 8/30  brain]
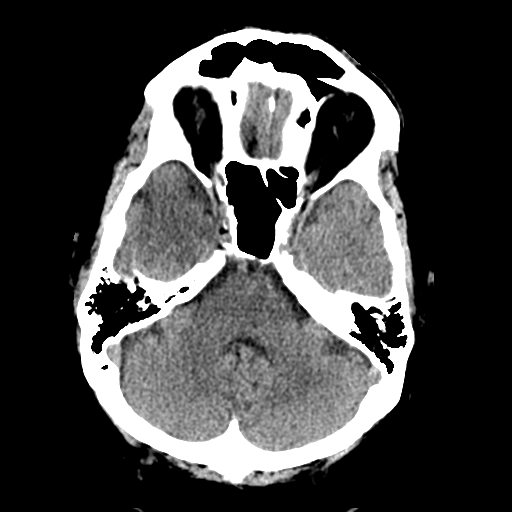
[im 11/30  brain]
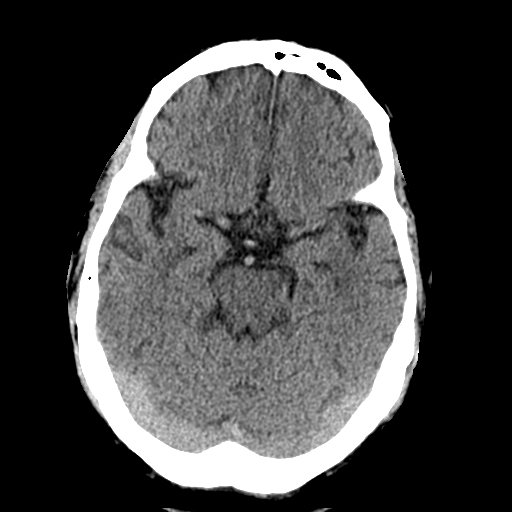
[im 15/30  brain]
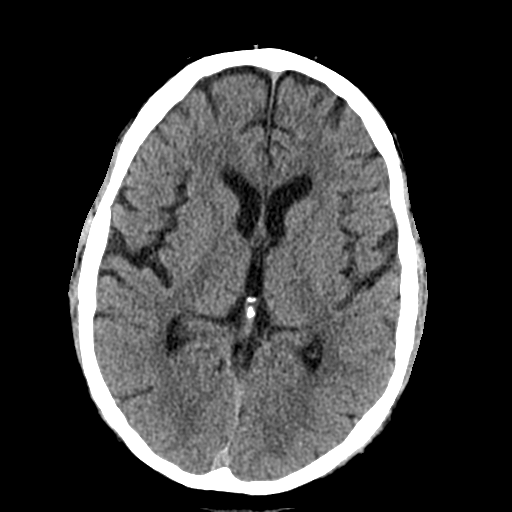
[im 19/30  brain]
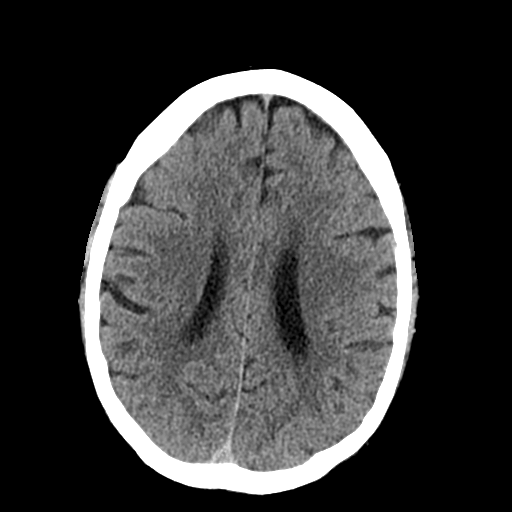
[im 19/30  bone]
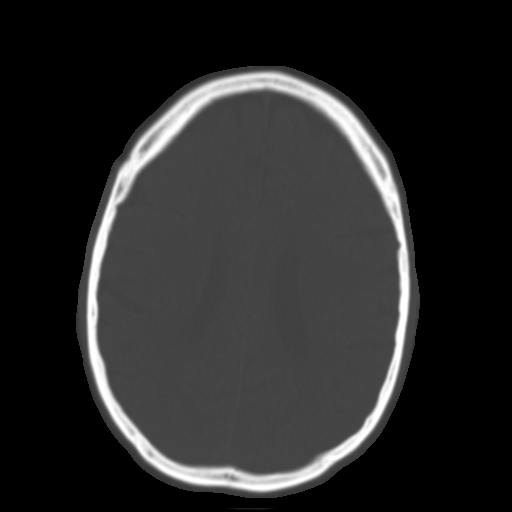
[im 22/30  brain]
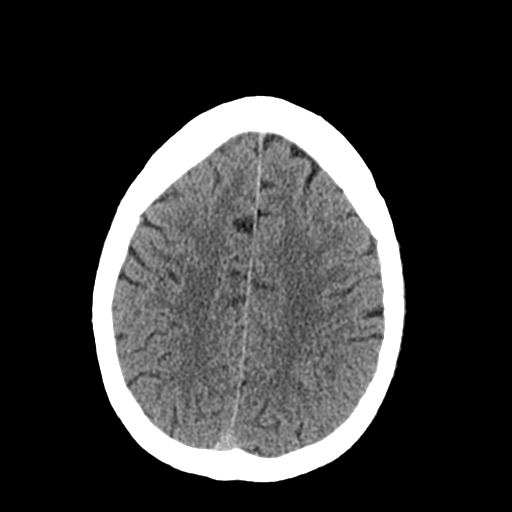
[im 26/30  brain]
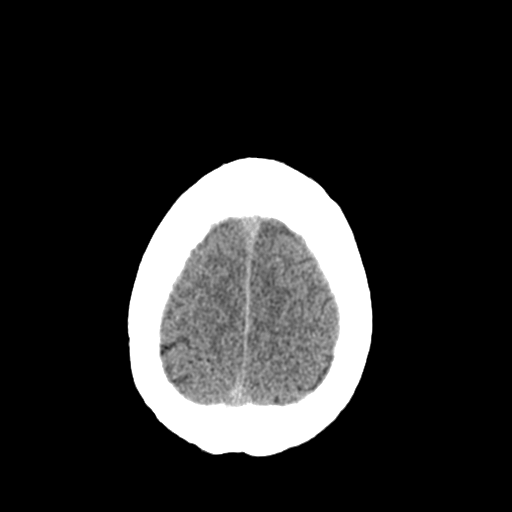

[Series 4: coronal soft tissue · coronal · 0.31mm/px · 3 of 73 slices shown]
[im 25/73  brain]
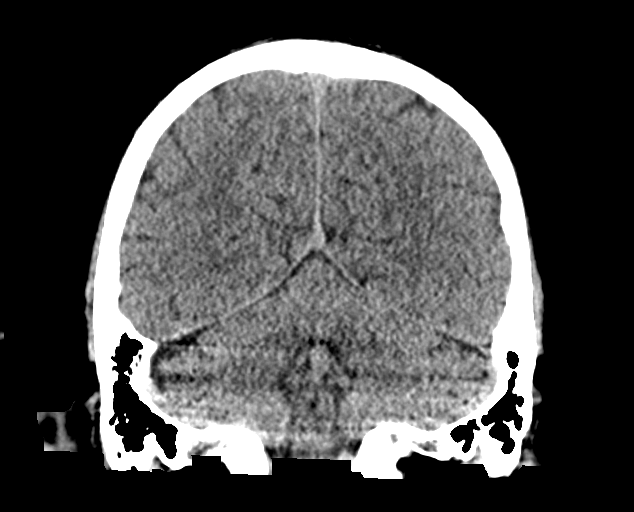
[im 33/73  brain]
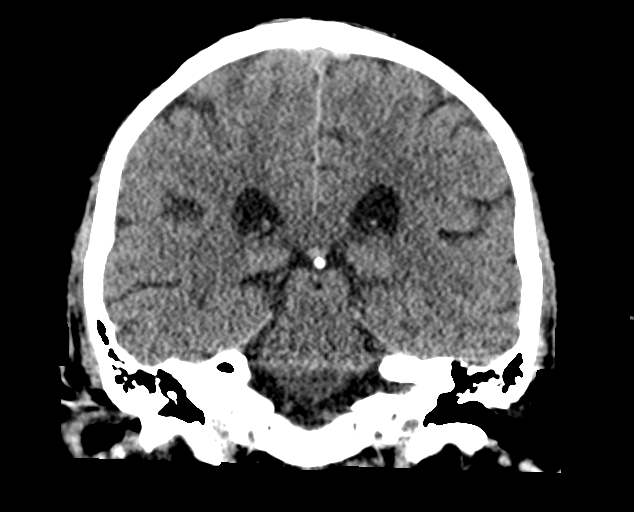
[im 41/73  brain]
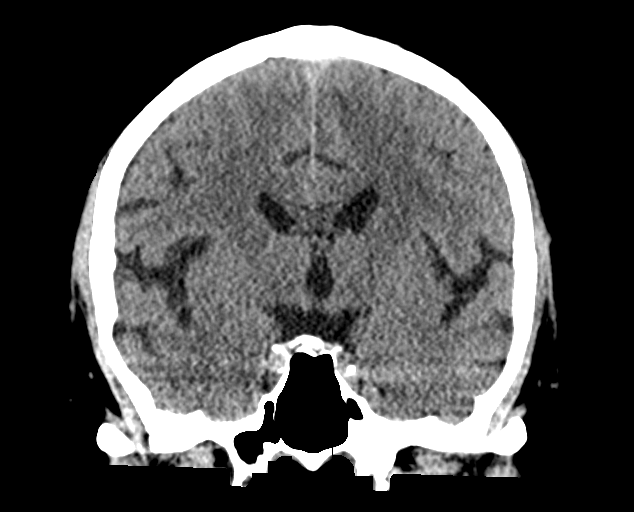

[Series 5: sagittal soft tissue · sagittal · 0.31mm/px · 3 of 67 slices shown]
[im 23/67  brain]
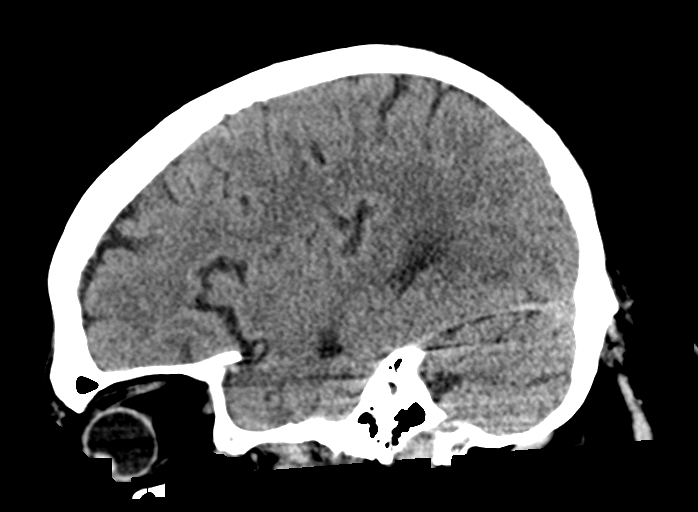
[im 34/67  brain]
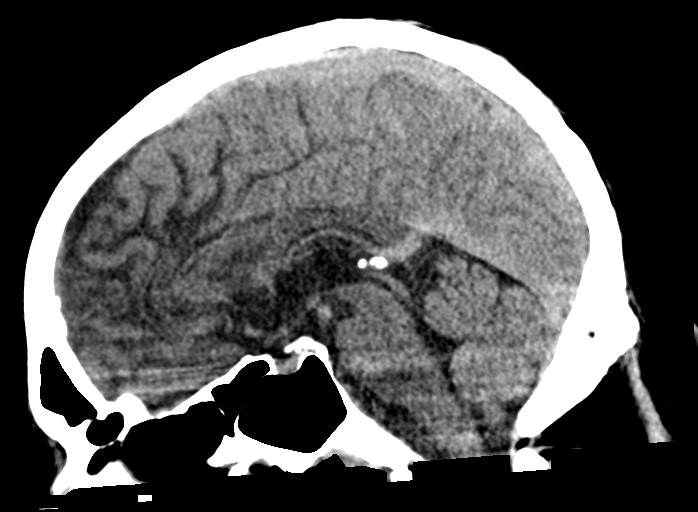
[im 45/67  brain]
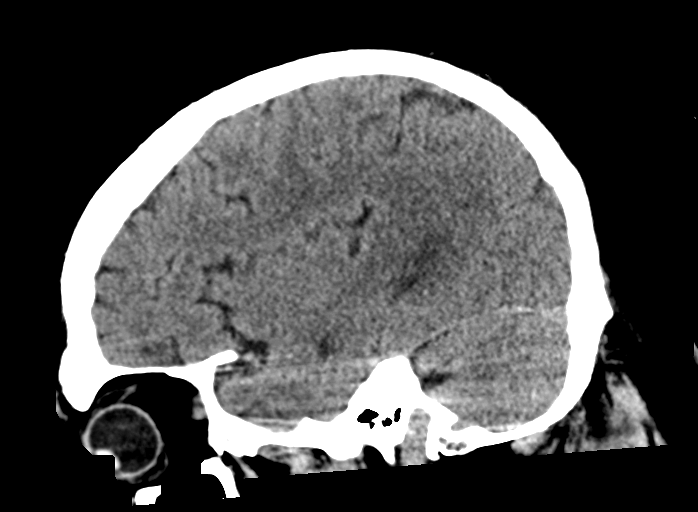

[15 of 47 positions shown; findings below may reference images not displayed]

FINDINGS: CT HEAD FINDINGS

Brain: Patchy areas of decreased attenuation are noted throughout
the deep and periventricular white matter of the cerebral
hemispheres bilaterally, compatible with mild chronic microvascular
ischemic disease. No evidence of acute infarction, hemorrhage,
hydrocephalus, extra-axial collection or mass lesion/mass effect.

Vascular: No hyperdense vessel or unexpected calcification.

Skull: Normal. Negative for fracture or focal lesion.

Sinuses/Orbits: No acute finding.

Other: None.

CT CERVICAL SPINE FINDINGS

Alignment: Reversal of normal cervical lordosis centered at the
level of C4, likely chronic and degenerative in nature. Alignment is
otherwise anatomic.

Skull base and vertebrae: No acute fracture. No primary bone lesion
or focal pathologic process.

Soft tissues and spinal canal: No prevertebral fluid or swelling. No
visible canal hematoma.

Disc levels: Multilevel degenerative disc disease, most pronounced
at C4-C5, C5-C6 and C6-C7. This results in severe narrowing of the
central spinal canal, most pronounced at C6-C7 where the AP diameter
of the canal is 5.5 mm. Multilevel facet arthropathy.

Upper chest: Unremarkable.

Other: None.
IMPRESSION: 1. No evidence of significant acute traumatic injury to the skull,
brain or cervical spine.
2. Mild chronic microvascular ischemic changes in the cerebral white
matter, as above.
3. Extensive multilevel degenerative disc disease and cervical
spondylosis, resulting in central spinal canal stenosis most severe
at C6-C7, as detailed above.

## 2023-12-28 IMAGING — CR DG SHOULDER 2+V*L*
3 series · 3 of 3 positions shown · non-contrast
Comparison: None Available.

CLINICAL DATA: Left shoulder pain after fall

EXAM:
LEFT SHOULDER - 2+ VIEW

[shoulder grashey]
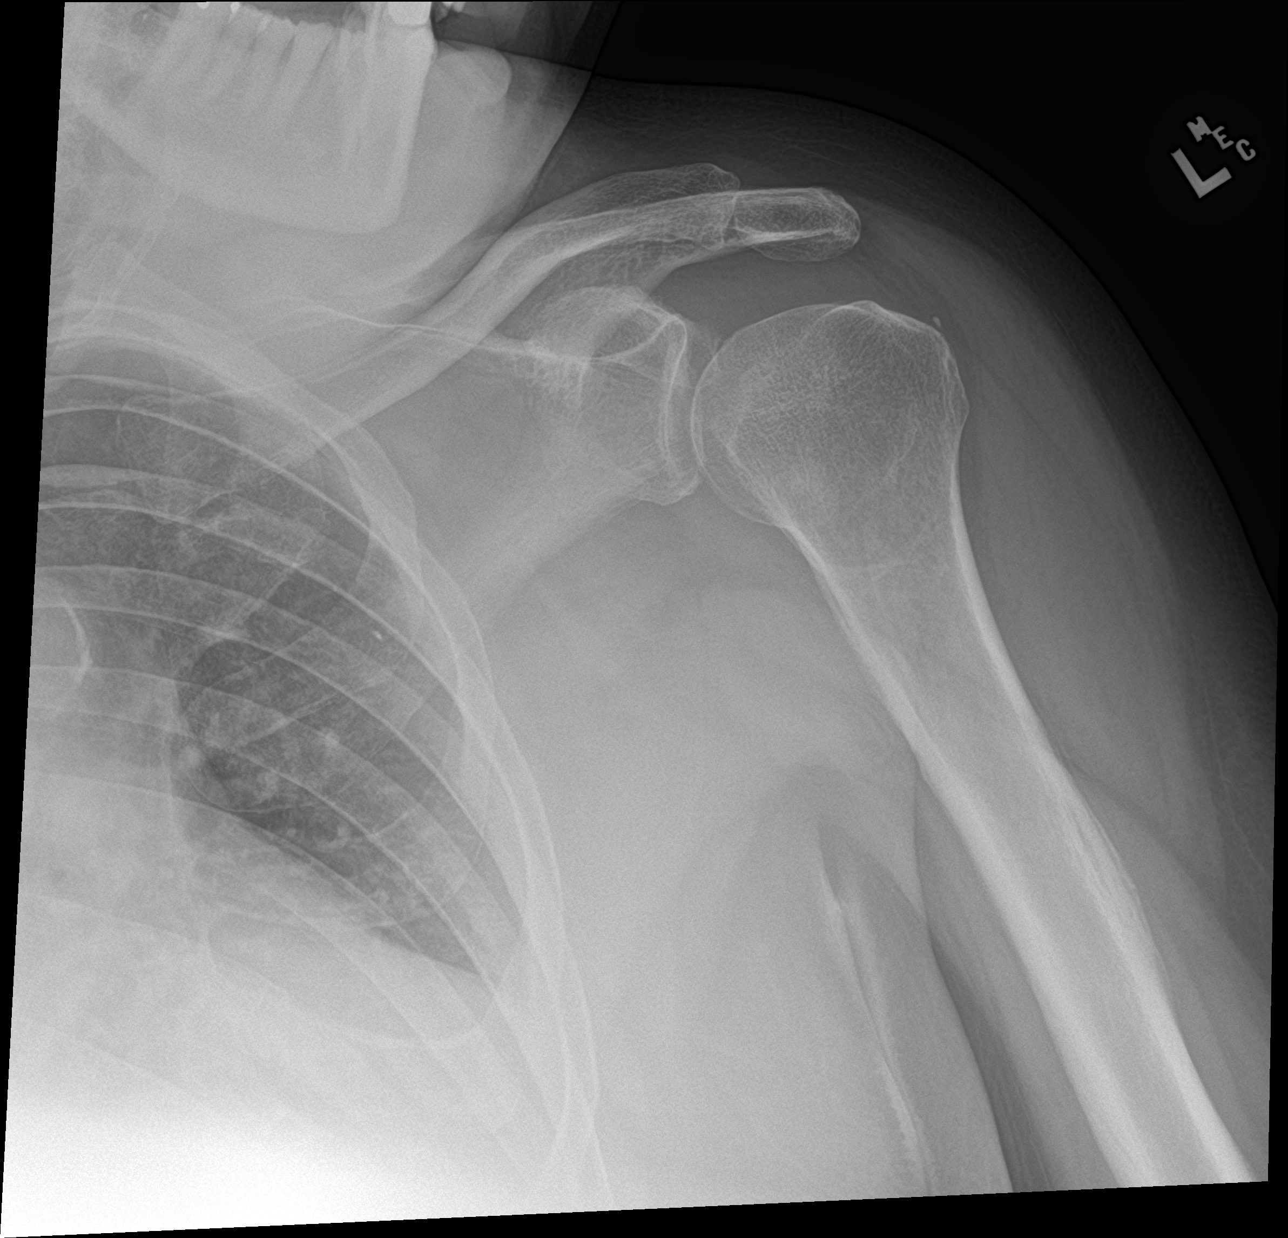

[shoulder y view]
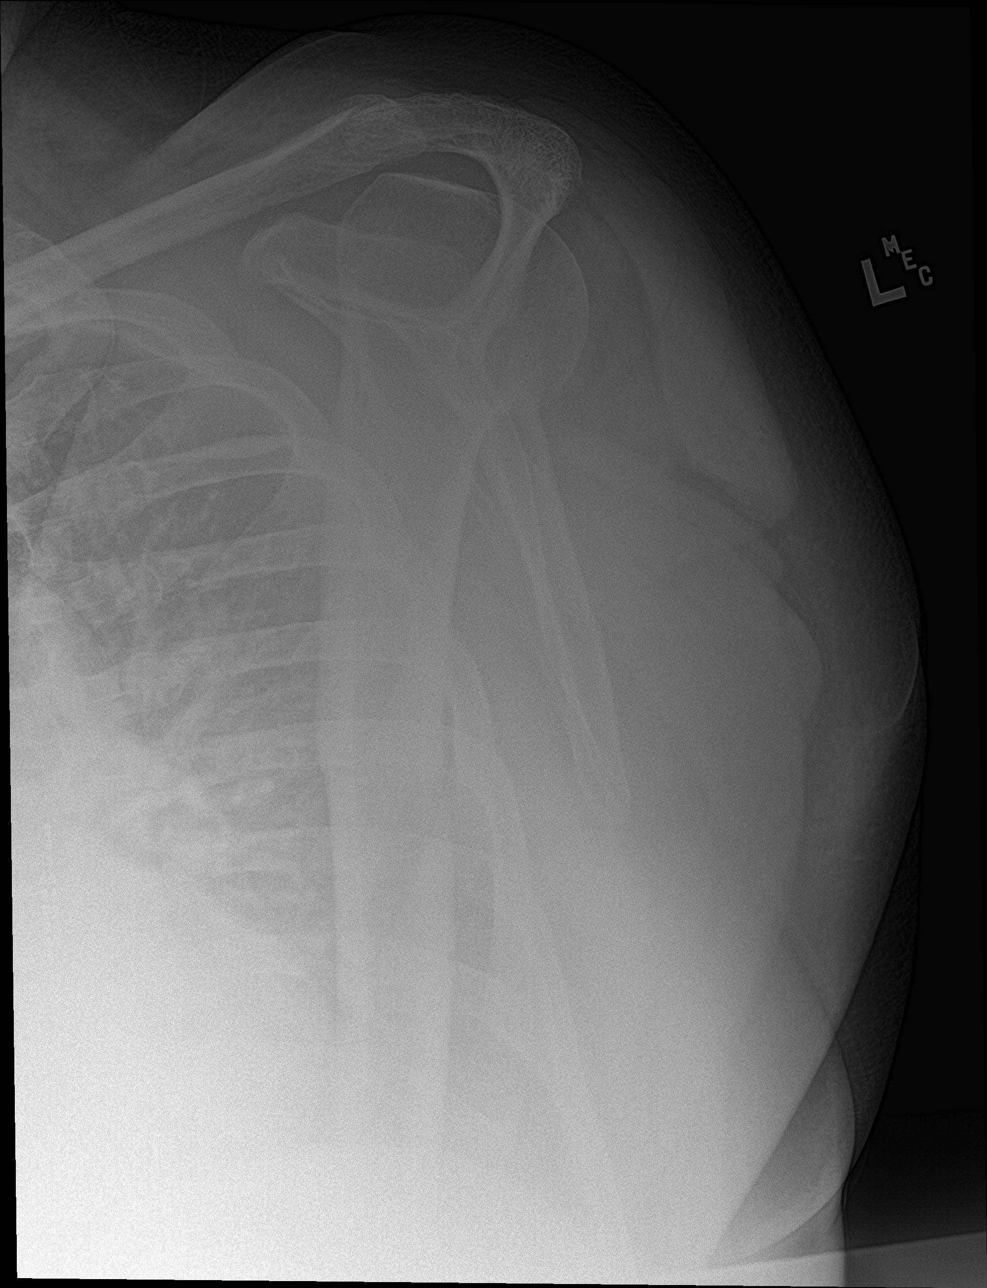

[shoulder ap neutral]
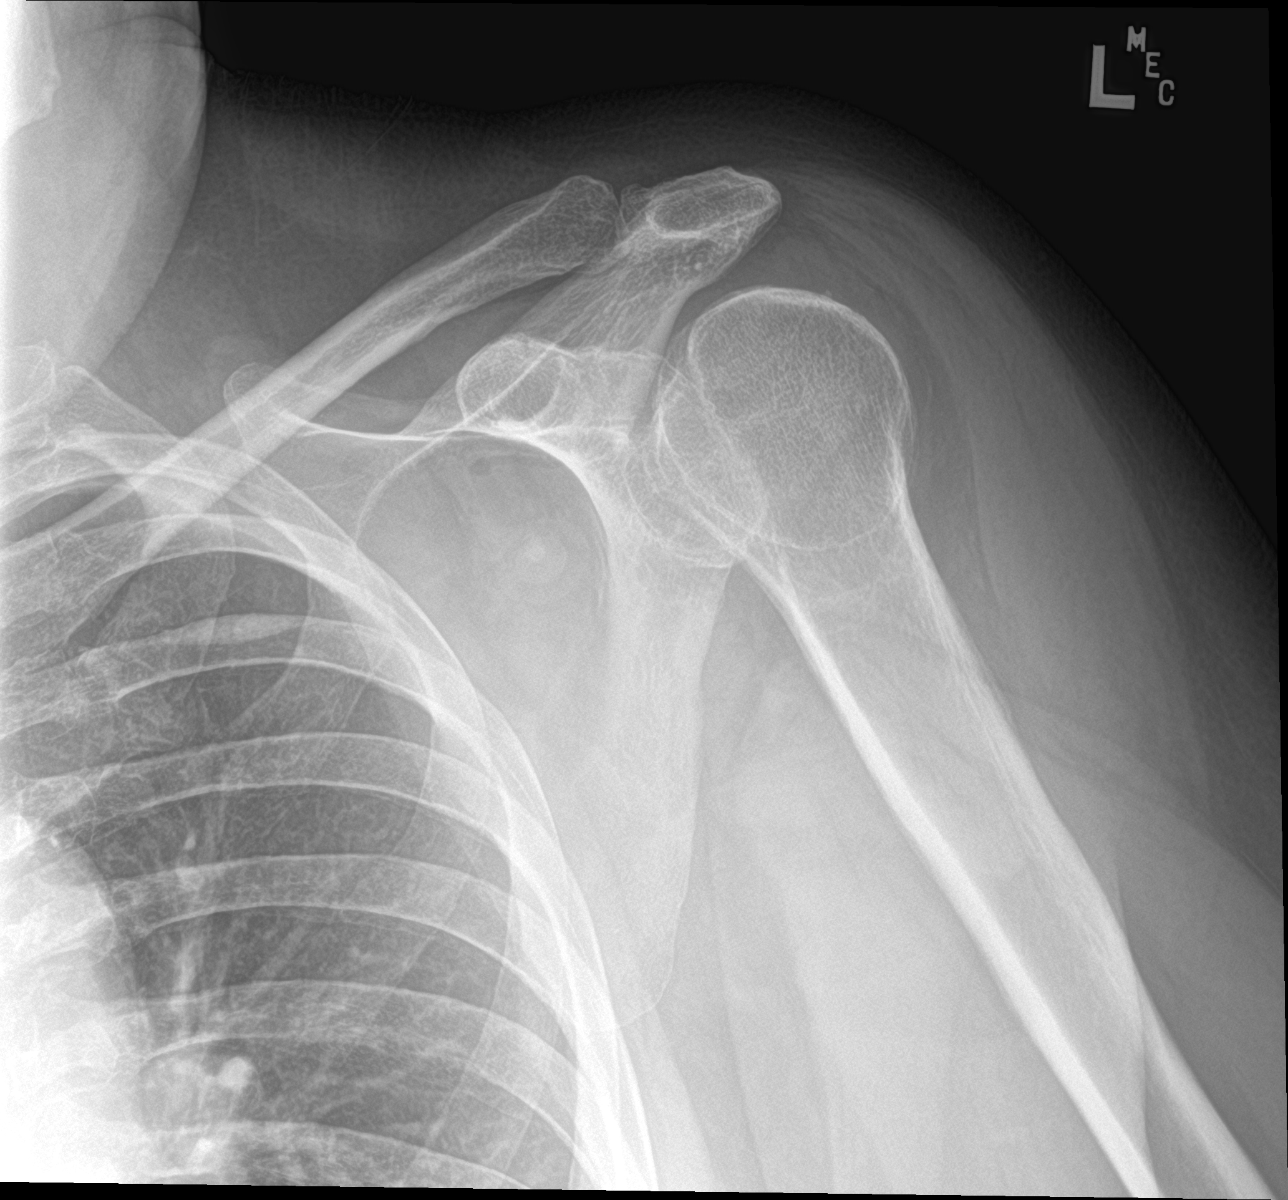

[3 of 3 positions shown; findings below may reference images not displayed]

FINDINGS: No acute fracture or dislocation. Minimal calcification at the
rotator cuff, presumably chronic with this history.
IMPRESSION: No acute finding.

## 2024-03-11 ENCOUNTER — Emergency Department
Admission: EM | Admit: 2024-03-11 | Discharge: 2024-03-11 | Discharge status: Against Medical Advice | Attending: Emergency Medicine | Admitting: Emergency Medicine

## 2024-03-11 DIAGNOSIS — S01111A Laceration without foreign body of right eyelid and periocular area, initial encounter: Secondary | ICD-10-CM | POA: Insufficient documentation

## 2024-03-11 DIAGNOSIS — Z5321 Procedure and treatment not carried out due to patient leaving prior to being seen by health care provider: Secondary | ICD-10-CM | POA: Insufficient documentation

## 2024-03-11 DIAGNOSIS — W01198A Fall on same level from slipping, tripping and stumbling with subsequent striking against other object, initial encounter: Secondary | ICD-10-CM | POA: Diagnosis not present

## 2024-03-11 NOTE — ED Triage Notes (Signed)
 Pt presented to ED BIBA from home with c/o fall while outside, hit head on rocks. Laceration to right eyebrow. Denies LOC. Denies thinners. A&Ox4.

## 2024-03-11 NOTE — ED Triage Notes (Signed)
 Pt states that he had a mechanical fall onto concrete, small laceration above R eye, no thinners, ETOh on board.

## 2024-03-11 NOTE — ED Notes (Signed)
 Pt left AMA
